# Patient Record
Sex: Male | Born: 1970 | Race: White | Hispanic: No | Marital: Married | Smoking: Never smoker
Health system: Southern US, Community
[De-identification: ages and names within clinical notes are randomized; demographics above are authoritative.]

## PROBLEM LIST (undated history)

## (undated) DIAGNOSIS — I10 Essential (primary) hypertension: Secondary | ICD-10-CM

## (undated) DIAGNOSIS — N200 Calculus of kidney: Secondary | ICD-10-CM

## (undated) DIAGNOSIS — T7840XA Allergy, unspecified, initial encounter: Secondary | ICD-10-CM

## (undated) HISTORY — DX: Allergy, unspecified, initial encounter: T78.40XA

## (undated) HISTORY — DX: Essential (primary) hypertension: I10

## (undated) HISTORY — DX: Calculus of kidney: N20.0

---

## 2009-07-04 ENCOUNTER — Emergency Department: Payer: Self-pay | Admitting: Emergency Medicine

## 2009-07-06 ENCOUNTER — Emergency Department: Payer: Self-pay | Admitting: Emergency Medicine

## 2009-10-17 ENCOUNTER — Encounter: Payer: Self-pay | Admitting: Internal Medicine

## 2010-04-18 ENCOUNTER — Emergency Department: Payer: Self-pay | Admitting: Emergency Medicine

## 2011-07-05 IMAGING — CR LEFT WRIST - COMPLETE 3+ VIEW
1 series · 4 of 4 positions shown · non-contrast
Comparison: none

REASON FOR EXAM: injury
COMMENTS:   May transport without cardiac monitor

PROCEDURE:     DXR - DXR WRIST LT COMP WITH OBLIQUES  - April 18, 2010  [DATE]
RESULT:     Comparison: None.

[Series 1: view not recorded · 0.17mm/px · 4 of 4 slices shown]
[im 1/4]
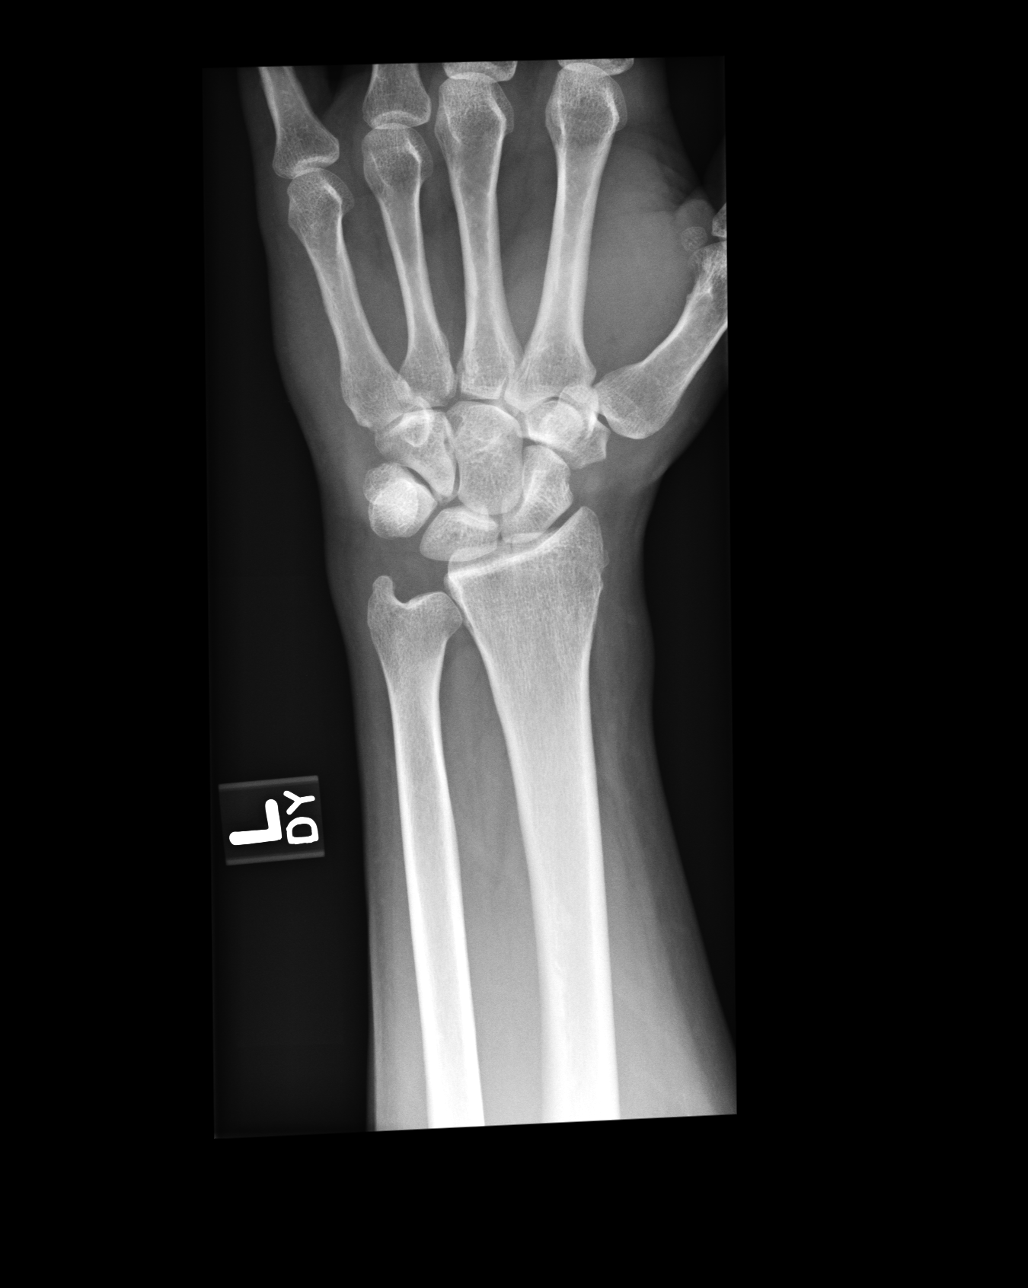
[im 2/4]
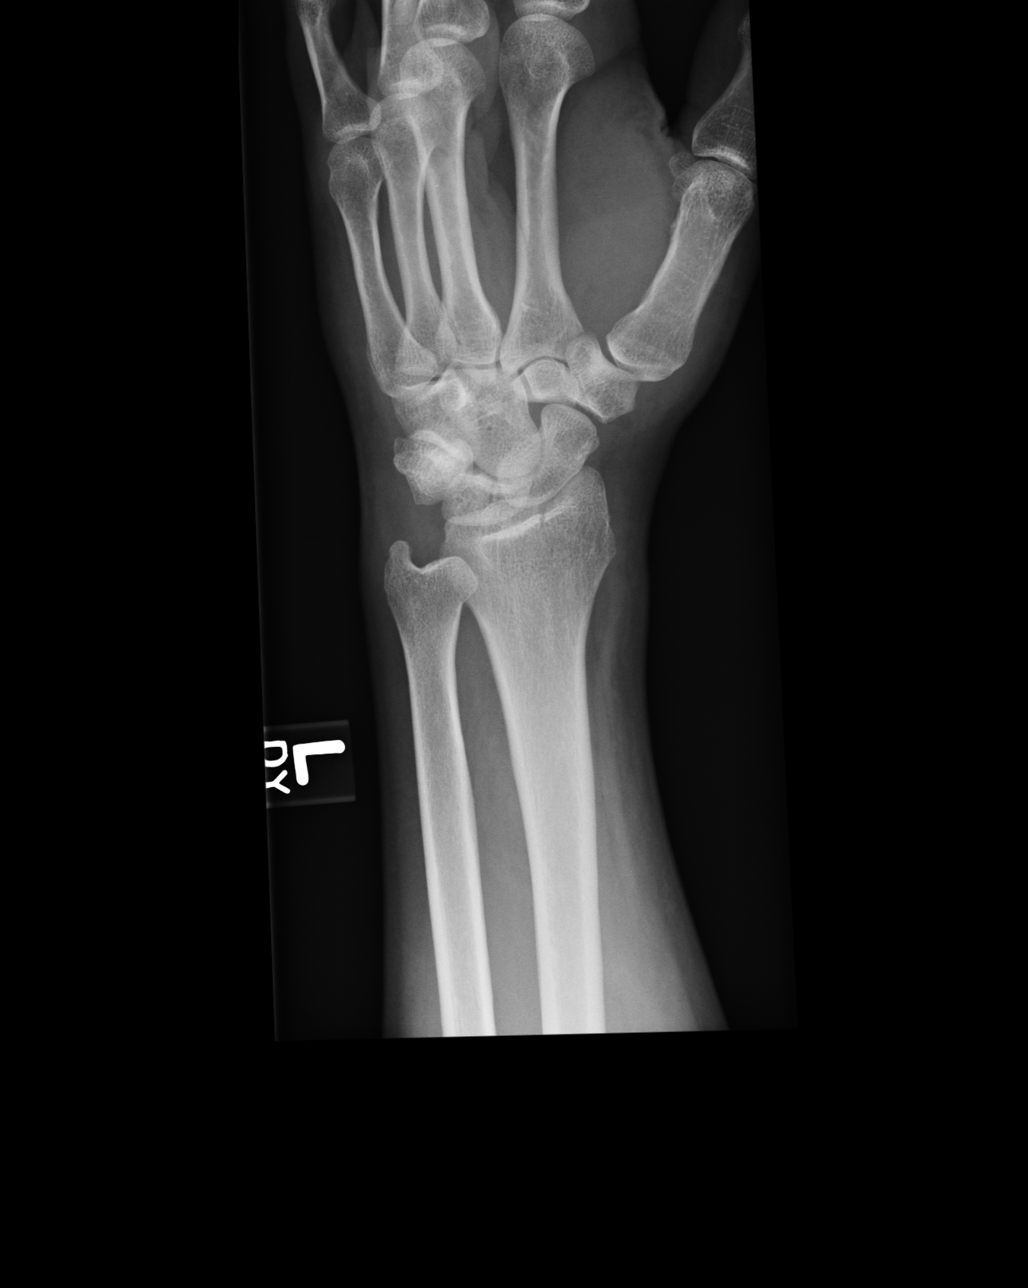
[im 3/4]
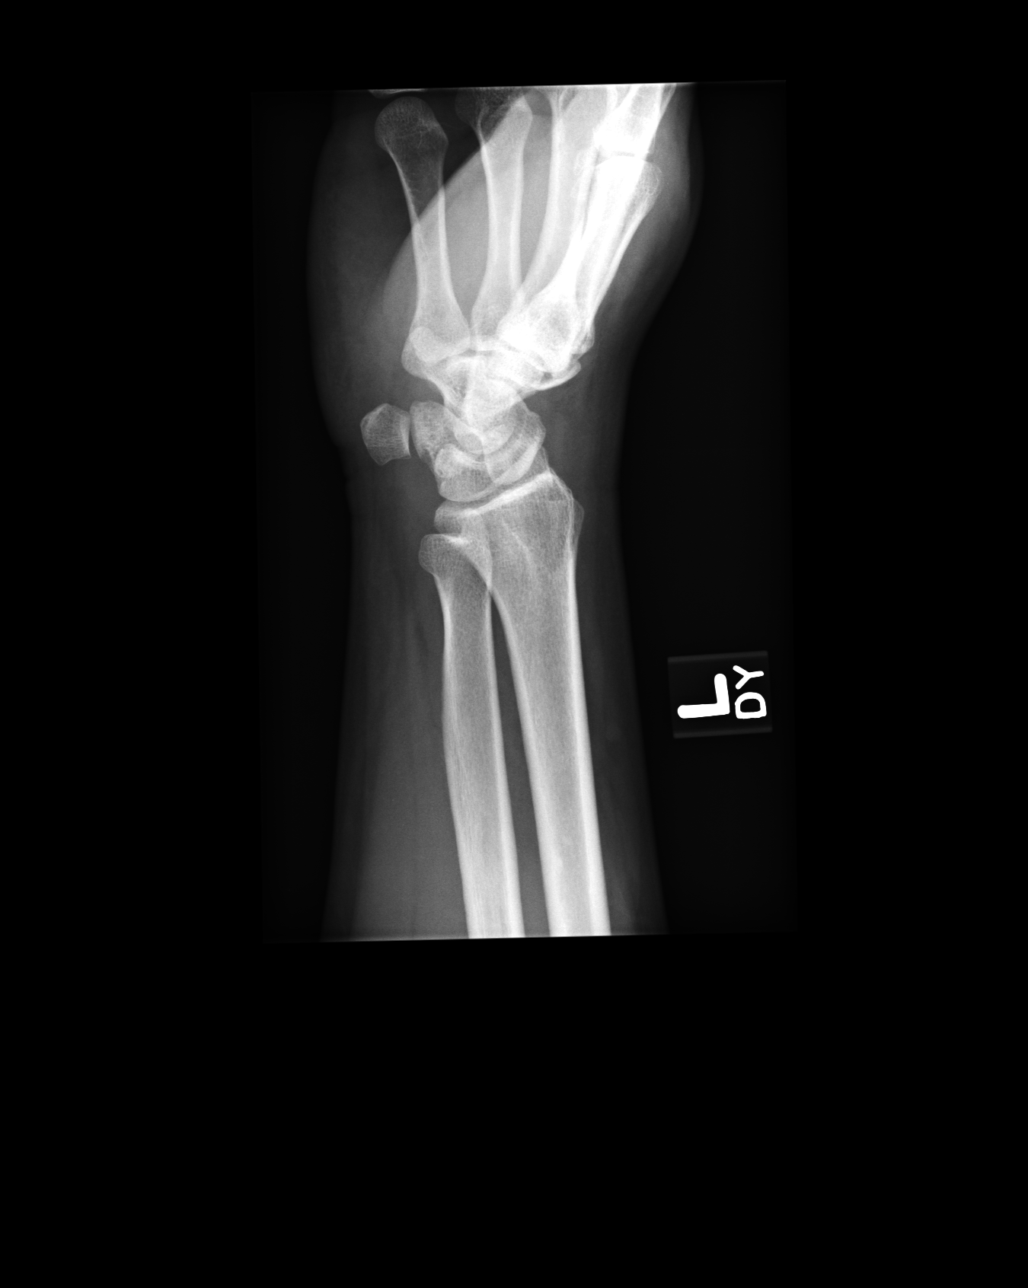
[im 4/4]
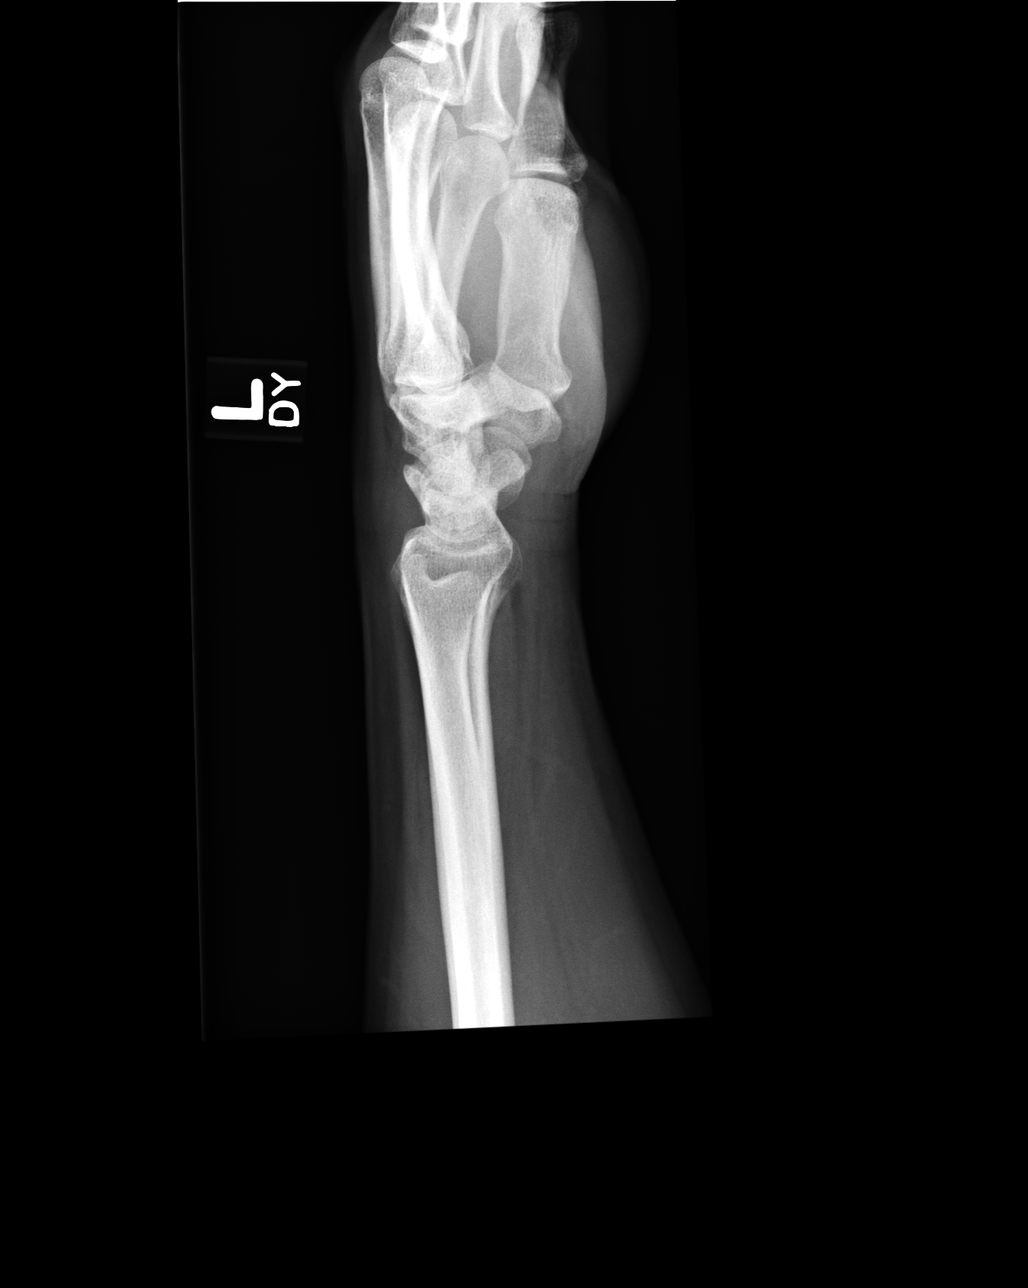

[4 of 4 positions shown; findings below may reference images not displayed]

FINDINGS: There is a nondisplaced fracture the distal radius. The fracture line
extends to the radiocarpal articular surface.
IMPRESSION: Nondisplaced distal radius fracture, with intraarticular extent.

Clinician aware of findings at time of dictation.

## 2012-05-22 ENCOUNTER — Emergency Department: Payer: Self-pay | Admitting: Emergency Medicine

## 2015-04-21 ENCOUNTER — Ambulatory Visit (INDEPENDENT_AMBULATORY_CARE_PROVIDER_SITE_OTHER): Payer: BC Managed Care – PPO | Admitting: Primary Care

## 2015-04-21 ENCOUNTER — Encounter (INDEPENDENT_AMBULATORY_CARE_PROVIDER_SITE_OTHER): Payer: Self-pay

## 2015-04-21 ENCOUNTER — Encounter: Payer: Self-pay | Admitting: Primary Care

## 2015-04-21 VITALS — BP 124/82 | HR 73 | Temp 97.8°F | Ht 72.0 in | Wt 276.1 lb

## 2015-04-21 DIAGNOSIS — I10 Essential (primary) hypertension: Secondary | ICD-10-CM | POA: Diagnosis not present

## 2015-04-21 NOTE — Progress Notes (Signed)
   Subjective:    Patient ID: Grant Keller, male    DOB: 1970/09/14, 44 y.o.   MRN: [id number]  HPI  Grant Keller is a 44 year old male who presents today to establish care and discuss the problems mentioned below. Will obtain old records. Last physical was completed several years ago.  1) Essential Hypertension: Diagnosed 5 years ago. Managed on amlodipine-benazepril 5-40 mg and bisprolol-HCTZ 5-6.25mg . He checks his blood pressure at home once weekly and was in 120's/80's. Denies chest pain, headaches. Will get some dizziness occasionally with postural changes. Denies feelings of syncope.   Review of Systems  Constitutional: Negative for unexpected weight change.  HENT: Negative for rhinorrhea.   Respiratory: Negative for cough and shortness of breath.   Cardiovascular: Negative for chest pain.  Gastrointestinal: Negative for diarrhea and constipation.  Genitourinary: Negative for difficulty urinating.  Musculoskeletal: Negative for myalgias and arthralgias.  Skin: Negative for rash.  Neurological: Negative for numbness and headaches.  Psychiatric/Behavioral:       Denies concerns for anxiety or depression.        Past Medical History  Diagnosis Date  . Allergy   . Hypertension   . Kidney stone     Social History   Social History  . Marital Status: Married    Spouse Name: N/A  . Number of Children: N/A  . Years of Education: N/A   Occupational History  . Not on file.   Social History Main Topics  . Smoking status: Never Smoker   . Smokeless tobacco: Not on file     Comment: used to chew tobacco  . Alcohol Use: 0.0 oz/week    0 Standard drinks or equivalent per week     Comment: social  . Drug Use: Not on file  . Sexual Activity: Not on file   Other Topics Concern  . Not on file   Social History Narrative   Married.   1 son.   Works in [employer].    Enjoys coaching baseball team, playing softball.       History reviewed. No pertinent past surgical  history.  Family History  Problem Relation Age of Onset  . Heart disease Father   . Hypertension Father   . Heart attack Father   . Aplastic anemia Father     Allergies  Allergen Reactions  . Naproxen Other (See Comments)  . Niacin Other (See Comments)    No current outpatient prescriptions on file prior to visit.   No current facility-administered medications on file prior to visit.    BP 124/82 mmHg  Pulse 73  Temp(Src) 97.8 F (36.6 C) (Oral)  Ht 6' (1.829 m)  Wt 276 lb 1.9 oz (125.247 kg)  BMI 37.44 kg/m2  SpO2 97%    Objective:   Physical Exam  Constitutional: He is oriented to person, place, and time. He appears well-nourished.  Cardiovascular: Regular rhythm.   Pulmonary/Chest: Effort normal and breath sounds normal.  Neurological: He is alert and oriented to person, place, and time.  Skin: Skin is warm.  Psychiatric: He has a normal mood and affect.          Assessment & Plan:

## 2015-04-21 NOTE — Assessment & Plan Note (Signed)
Managed on amlodipine-benazepril 5/40mg  and bisprolol-hctz 5/6.25mg  as started by previous provided. Stable today. Continue current regimen.

## 2015-04-21 NOTE — Patient Instructions (Signed)
Please schedule a physical with me in the next 3 months. You will also schedule a lab only appointment one week prior. We will discuss your lab results during your physical.  It was a pleasure to meet you today! Please don't hesitate to call me with any questions. Welcome to [facility]!

## 2015-04-21 NOTE — Progress Notes (Signed)
Pre visit review using our clinic review tool, if applicable. No additional management support is needed unless otherwise documented below in the visit note. 

## 2015-06-24 ENCOUNTER — Ambulatory Visit (INDEPENDENT_AMBULATORY_CARE_PROVIDER_SITE_OTHER): Payer: BC Managed Care – PPO | Admitting: Primary Care

## 2015-06-24 VITALS — BP 132/84 | HR 86 | Temp 97.8°F | Ht 72.0 in | Wt 281.6 lb

## 2015-06-24 DIAGNOSIS — H6691 Otitis media, unspecified, right ear: Secondary | ICD-10-CM

## 2015-06-24 MED ORDER — AMOXICILLIN 500 MG PO CAPS: 500.0000 mg | ORAL_CAPSULE | Freq: Two times a day (BID) | ORAL | Status: DC

## 2015-06-24 NOTE — Progress Notes (Signed)
Subjective:    Patient ID: Grant Keller, male    DOB: March 13, 1971, 44 y.o.   MRN: [id number]  HPI  Mr. Roycroft is a 44 year old male who presents today with a chief complaint of ear pain/pressure. His pain is located to the left ear and has been present for a little over 1 week. He's taken ibuprofen and has used a peroxide treatment daily for 1-2 minutes increments twice daily. Overall this provided temporary relief. His pain became worse over the past 2-3 days. He also has sore throat. Denies fevers, body aches, cough, nasal congestion.   Review of Systems  Constitutional: Negative for fever and chills.  HENT: Positive for ear pain and sore throat. Negative for congestion, postnasal drip and sinus pressure.   Respiratory: Negative for cough and shortness of breath.   Cardiovascular: Negative for chest pain.  Musculoskeletal: Negative for myalgias.  Neurological: Negative for headaches.       Past Medical History  Diagnosis Date  . Allergy   . Hypertension   . Kidney stone     Social History   Social History  . Marital Status: Married    Spouse Name: N/A  . Number of Children: N/A  . Years of Education: N/A   Occupational History  . Not on file.   Social History Main Topics  . Smoking status: Never Smoker   . Smokeless tobacco: Not on file     Comment: used to chew tobacco  . Alcohol Use: 0.0 oz/week    0 Standard drinks or equivalent per week     Comment: social  . Drug Use: Not on file  . Sexual Activity: Not on file   Other Topics Concern  . Not on file   Social History Narrative   Married.   1 son.   Works in [employer].    Enjoys coaching baseball team, playing softball.       No past surgical history on file.  Family History  Problem Relation Age of Onset  . Heart disease Father   . Hypertension Father   . Heart attack Father   . Aplastic anemia Father     Allergies  Allergen Reactions  . Naproxen Other (See Comments)  . Niacin Other (See  Comments)    Current Outpatient Prescriptions on File Prior to Visit  Medication Sig Dispense Refill  . amLODipine-benazepril (LOTREL) 5-40 MG per capsule Take by mouth.    . bisoprolol-hydrochlorothiazide (ZIAC) 5-6.25 MG per tablet Take by mouth.     No current facility-administered medications on file prior to visit.    BP 132/84 mmHg  Pulse 86  Temp(Src) 97.8 F (36.6 C) (Oral)  Ht 6' (1.829 m)  Wt 281 lb 9.6 oz (127.733 kg)  BMI 38.18 kg/m2  SpO2 98%    Objective:   Physical Exam  Constitutional: He appears well-nourished.  HENT:  Right Ear: Ear canal normal. Tympanic membrane is injected and erythematous.  Left Ear: Tympanic membrane and ear canal normal.  Nose: Nose normal. Right sinus exhibits no maxillary sinus tenderness and no frontal sinus tenderness. Left sinus exhibits no maxillary sinus tenderness and no frontal sinus tenderness.  Mouth/Throat: Oropharynx is clear and moist.  Eyes: Conjunctivae are normal. Pupils are equal, round, and reactive to light.  Neck: Neck supple.  Cardiovascular: Normal rate and regular rhythm.   Pulmonary/Chest: Effort normal and breath sounds normal.  Lymphadenopathy:    He has no cervical adenopathy.  Skin: Skin is warm and dry.  Assessment & Plan:  Acute Otitis Media:  Pain and pressure to right ear x 1 week, worse over past 2-3 days. Right TM injected with erythema. Tender to tragus, however canal does not appear very swollen.  Left TM WNL. HEENT exam otherwise unremarkable. Treat with Amoxicillin 500 mg BID x 7 days. Discussed ibuprofen PRN. No q-tips! Follow up PRN

## 2015-06-24 NOTE — Progress Notes (Signed)
Pre visit review using our clinic review tool, if applicable. No additional management support is needed unless otherwise documented below in the visit note. 

## 2015-06-24 NOTE — Patient Instructions (Signed)
Start Amoxicillin antibiotics for ear infection. Take 1 capsule by mouth twice daily for 7 days.  Do not use a q-tip in your ear canal as this may cause irritation.   Please notify me if you do not feel better in the next 3-4 days.  It was a pleasure to see you today!  Otitis Media, Adult Otitis media is redness, soreness, and inflammation of the middle ear. Otitis media may be caused by allergies or, most commonly, by infection. Often it occurs as a complication of the common cold. SIGNS AND SYMPTOMS Symptoms of otitis media may include:  Earache.  Fever.  Ringing in your ear.  Headache.  Leakage of fluid from the ear. DIAGNOSIS To diagnose otitis media, your health care provider will examine your ear with an otoscope. This is an instrument that allows your health care provider to see into your ear in order to examine your eardrum. Your health care provider also will ask you questions about your symptoms. TREATMENT  Typically, otitis media resolves on its own within 3-5 days. Your health care provider may prescribe medicine to ease your symptoms of pain. If otitis media does not resolve within 5 days or is recurrent, your health care provider may prescribe antibiotic medicines if he or she suspects that a bacterial infection is the cause. HOME CARE INSTRUCTIONS   If you were prescribed an antibiotic medicine, finish it all even if you start to feel better.  Take medicines only as directed by your health care provider.  Keep all follow-up visits as directed by your health care provider. SEEK MEDICAL CARE IF:  You have otitis media only in one ear, or bleeding from your nose, or both.  You notice a lump on your neck.  You are not getting better in 3-5 days.  You feel worse instead of better. SEEK IMMEDIATE MEDICAL CARE IF:   You have pain that is not controlled with medicine.  You have swelling, redness, or pain around your ear or stiffness in your neck.  You notice  that part of your face is paralyzed.  You notice that the bone behind your ear (mastoid) is tender when you touch it. MAKE SURE YOU:   Understand these instructions.  Will watch your condition.  Will get help right away if you are not doing well or get worse.   This information is not intended to replace advice given to you by your health care provider. Make sure you discuss any questions you have with your health care provider.   Document Released: 05/14/2004 Document Revised: 08/30/2014 Document Reviewed: 03/06/2013 Elsevier Interactive Patient Education [employer].

## 2015-07-11 ENCOUNTER — Other Ambulatory Visit: Payer: Self-pay | Admitting: Internal Medicine

## 2015-07-11 DIAGNOSIS — Z0000 Encounter for general adult medical examination without abnormal findings: Secondary | ICD-10-CM

## 2015-07-21 ENCOUNTER — Other Ambulatory Visit (INDEPENDENT_AMBULATORY_CARE_PROVIDER_SITE_OTHER): Payer: BC Managed Care – PPO

## 2015-07-21 DIAGNOSIS — Z0000 Encounter for general adult medical examination without abnormal findings: Secondary | ICD-10-CM

## 2015-07-21 LAB — COMPREHENSIVE METABOLIC PANEL
ALT: 29 U/L (ref 0–53)
AST: 22 U/L (ref 0–37)
Albumin: 4.4 g/dL (ref 3.5–5.2)
Alkaline Phosphatase: 71 U/L (ref 39–117)
BUN: 16 mg/dL (ref 6–23)
CHLORIDE: 102 meq/L (ref 96–112)
CO2: 27 meq/L (ref 19–32)
CREATININE: 0.99 mg/dL (ref 0.40–1.50)
Calcium: 9.6 mg/dL (ref 8.4–10.5)
GFR: 87.54 mL/min (ref >60.00)
Glucose, Bld: 104 mg/dL — ABNORMAL HIGH (ref 70–99)
POTASSIUM: 4.3 meq/L (ref 3.5–5.1)
SODIUM: 137 meq/L (ref 135–145)
Total Bilirubin: 0.5 mg/dL (ref 0.2–1.2)
Total Protein: 7.3 g/dL (ref 6.0–8.3)

## 2015-07-21 LAB — LIPID PANEL
CHOL/HDL RATIO: 7
Cholesterol: 190 mg/dL (ref 0–200)
HDL: 28 mg/dL — ABNORMAL LOW (ref >39.00)
LDL CALC: 123 mg/dL — AB (ref 0–99)
NonHDL: 161.87
TRIGLYCERIDES: 192 mg/dL — AB (ref 0.0–149.0)
VLDL: 38.4 mg/dL (ref 0.0–40.0)

## 2015-07-21 LAB — CBC
HCT: 47.2 % (ref 39.0–52.0)
Hemoglobin: 15.6 g/dL (ref 13.0–17.0)
MCHC: 33 g/dL (ref 30.0–36.0)
MCV: 89.5 fl (ref 78.0–100.0)
Platelets: 209 10*3/uL (ref 150.0–400.0)
RBC: 5.27 Mil/uL (ref 4.22–5.81)
RDW: 12.6 % (ref 11.5–15.5)
WBC: 10.3 10*3/uL (ref 4.0–10.5)

## 2015-07-24 ENCOUNTER — Encounter: Payer: Self-pay | Admitting: Primary Care

## 2015-07-24 ENCOUNTER — Ambulatory Visit (INDEPENDENT_AMBULATORY_CARE_PROVIDER_SITE_OTHER): Payer: BC Managed Care – PPO | Admitting: Primary Care

## 2015-07-24 VITALS — BP 128/88 | HR 84 | Temp 97.9°F | Ht 72.0 in | Wt 282.4 lb

## 2015-07-24 DIAGNOSIS — Z0000 Encounter for general adult medical examination without abnormal findings: Secondary | ICD-10-CM | POA: Diagnosis not present

## 2015-07-24 DIAGNOSIS — E781 Pure hyperglyceridemia: Secondary | ICD-10-CM

## 2015-07-24 DIAGNOSIS — E785 Hyperlipidemia, unspecified: Secondary | ICD-10-CM | POA: Insufficient documentation

## 2015-07-24 DIAGNOSIS — R0683 Snoring: Secondary | ICD-10-CM | POA: Diagnosis not present

## 2015-07-24 DIAGNOSIS — Z125 Encounter for screening for malignant neoplasm of prostate: Secondary | ICD-10-CM

## 2015-07-24 DIAGNOSIS — I10 Essential (primary) hypertension: Secondary | ICD-10-CM | POA: Diagnosis not present

## 2015-07-24 DIAGNOSIS — G4733 Obstructive sleep apnea (adult) (pediatric): Secondary | ICD-10-CM | POA: Insufficient documentation

## 2015-07-24 LAB — PSA: PSA: 0.52 ng/mL (ref 0.10–4.00)

## 2015-07-24 LAB — TSH: TSH: 1.72 u[IU]/mL (ref 0.35–4.50)

## 2015-07-24 NOTE — Progress Notes (Signed)
Subjective:    Patient ID: Grant Keller, male    DOB: 1971-03-05, 44 y.o.   MRN: [id number]  HPI  Grant Keller is a 44 year old male who presents today for complete physical.  Immunizations: -Tetanus: Completed in April 2009 -Influenza: Declines   Diet: Endorses a fair diet. Breakfast: BLT at the store Lunch: Hansel Starling, fast food Dinner: Home made meals. Lean meats, pastas, vegetables, rice, potatoes.  Snacks: Occasionally on candy, chips Desserts: Occasionally Beverages: Gatorade, water, coke Exercise: He is not exercising. Plans on starting. Eye exam: Completed years ago. Dental exam: Completed 1-2 months ago.    Review of Systems  Constitutional: Negative for unexpected weight change.  HENT: Negative for rhinorrhea.   Respiratory: Negative for cough and shortness of breath.   Cardiovascular: Negative for chest pain.  Gastrointestinal: Negative for diarrhea and constipation.  Genitourinary: Negative for difficulty urinating.  Musculoskeletal: Negative for myalgias and arthralgias.  Skin: Negative for rash.  Neurological: Negative for dizziness, numbness and headaches.  Psychiatric/Behavioral:       Denies concerns for depression.   1) OSA: History of snoring and wakes up at night gasping for air. His wife notices he stops breathing in the middle of the night. This has been present for the past 2-3 years. He's not exercising or working to lose weight.      Past Medical History  Diagnosis Date  . Allergy   . Hypertension   . Kidney stone     Social History   Social History  . Marital Status: Married    Spouse Name: N/A  . Number of Children: N/A  . Years of Education: N/A   Occupational History  . Not on file.   Social History Main Topics  . Smoking status: Never Smoker   . Smokeless tobacco: Not on file     Comment: used to chew tobacco  . Alcohol Use: 0.0 oz/week    0 Standard drinks or equivalent per week     Comment: social  . Drug Use: Not on file   . Sexual Activity: Not on file   Other Topics Concern  . Not on file   Social History Narrative   Married.   1 son.   Works in [employer].    Enjoys coaching baseball team, playing softball.       No past surgical history on file.  Family History  Problem Relation Age of Onset  . Heart disease Father   . Hypertension Father   . Heart attack Father   . Aplastic anemia Father     Allergies  Allergen Reactions  . Naproxen Other (See Comments)  . Niacin Other (See Comments)    Current Outpatient Prescriptions on File Prior to Visit  Medication Sig Dispense Refill  . amLODipine-benazepril (LOTREL) 5-40 MG per capsule Take by mouth.    . bisoprolol-hydrochlorothiazide (ZIAC) 5-6.25 MG per tablet Take by mouth.     No current facility-administered medications on file prior to visit.    BP 128/88 mmHg  Pulse 84  Temp(Src) 97.9 F (36.6 C) (Oral)  Ht 6' (1.829 m)  Wt 282 lb 6.4 oz (128.096 kg)  BMI 38.29 kg/m2  SpO2 97%     Objective:   Physical Exam  Constitutional: He is oriented to person, place, and time. He appears well-nourished.  HENT:  Right Ear: Tympanic membrane and ear canal normal.  Left Ear: Tympanic membrane and ear canal normal.  Nose: Nose normal.  Mouth/Throat: Oropharynx is clear and  moist.  Eyes: Conjunctivae and EOM are normal. Pupils are equal, round, and reactive to light.  Neck: Neck supple. No thyromegaly present.  Cardiovascular: Normal rate and regular rhythm.   Pulmonary/Chest: Effort normal and breath sounds normal.  Abdominal: Soft. Bowel sounds are normal. There is no tenderness.  Musculoskeletal: Normal range of motion.  Lymphadenopathy:    He has no cervical adenopathy.  Neurological: He is alert and oriented to person, place, and time. He has normal reflexes. No cranial nerve deficit.  Skin: Skin is warm and dry. No rash noted.  Psychiatric: He has a normal mood and affect.          Assessment & Plan:

## 2015-07-24 NOTE — Assessment & Plan Note (Signed)
Snoring, waking up at night gasping for air, obese. Sent for evaluation for sleep apnea.

## 2015-07-24 NOTE — Assessment & Plan Note (Addendum)
Stable today. Continue current regimen. Follow up in 6 months.

## 2015-07-24 NOTE — Progress Notes (Signed)
Pre visit review using our clinic review tool, if applicable. No additional management support is needed unless otherwise documented below in the visit note. 

## 2015-07-24 NOTE — Patient Instructions (Signed)
You will be contacted regarding your referral to Pulmonology for your sleep study.  Please let us know if you have not heard back within one week.   Complete lab work prior to leaving today. I will notify you of your results.  It is important that you improve your diet. Please limit carbohydrates in the form of white bread, rice, pasta, cakes, cookies, sugary drinks, etc. Increase your consumption of fresh fruits and vegetables.  You need to consume about 2 liters of water daily.  Start exercising. You should be getting 1 hour of moderate intensity exercise 5 days weekly.  Start Fish Oil 2000 mg daily.  Schedule a lab only appointment in 3 months for re-check of your cholesterol.  Follow up in 6 months for evaluation of blood pressure.  It was a pleasure to see you today!  Food Choices to Lower Your Triglycerides Triglycerides are a type of fat in your blood. High levels of triglycerides can increase the risk of heart disease and stroke. If your triglyceride levels are high, the foods you eat and your eating habits are very important. Choosing the right foods can help lower your triglycerides.  WHAT GENERAL GUIDELINES DO I NEED TO FOLLOW?  Lose weight if you are overweight.   Limit or avoid alcohol.   Fill one half of your plate with vegetables and green salads.   Limit fruit to two servings a day. Choose fruit instead of juice.   Make one fourth of your plate whole grains. Look for the word "whole" as the first word in the ingredient list.  Fill one fourth of your plate with lean protein foods.  Enjoy fatty fish (such as salmon, mackerel, sardines, and tuna) three times a week.   Choose healthy fats.   Limit foods high in starch and sugar.  Eat more home-cooked food and less restaurant, buffet, and fast food.  Limit fried foods.  Cook foods using methods other than frying.  Limit saturated fats.  Check ingredient lists to avoid foods with partially hydrogenated  oils (trans fats) in them. WHAT FOODS CAN I EAT?  Grains Whole grains, such as whole wheat or whole grain breads, crackers, cereals, and pasta. Unsweetened oatmeal, bulgur, barley, quinoa, or brown rice. Corn or whole wheat flour tortillas.  Vegetables Fresh or frozen vegetables (raw, steamed, roasted, or grilled). Green salads. Fruits All fresh, canned (in natural juice), or frozen fruits. Meat and Other Protein Products Ground beef (85% or leaner), grass-fed beef, or beef trimmed of fat. Skinless chicken or [place]. Ground chicken or [place]. Pork trimmed of fat. All fish and seafood. Eggs. Dried beans, peas, or lentils. Unsalted nuts or seeds. Unsalted canned or dry beans. Dairy Low-fat dairy products, such as skim or 1% milk, 2% or reduced-fat cheeses, low-fat ricotta or cottage cheese, or plain low-fat yogurt. Fats and Oils Tub margarines without trans fats. Light or reduced-fat mayonnaise and salad dressings. Avocado. Safflower, olive, or canola oils. Natural peanut or almond butter. The items listed above may not be a complete list of recommended foods or beverages. Contact your dietitian for more options. WHAT FOODS ARE NOT RECOMMENDED?  Grains White bread. White pasta. White rice. Cornbread. Bagels, pastries, and croissants. Crackers that contain trans fat. Vegetables White potatoes. Corn. Creamed or fried vegetables. Vegetables in a cheese sauce. Fruits Dried fruits. Canned fruit in light or heavy syrup. Fruit juice. Meat and Other Protein Products Fatty cuts of meat. Ribs, chicken wings, bacon, sausage, bologna, salami, chitterlings, fatback, hot dogs, bratwurst,  and packaged luncheon meats. Dairy Whole or 2% milk, cream, half-and-half, and cream cheese. Whole-fat or sweetened yogurt. Full-fat cheeses. Nondairy creamers and whipped toppings. Processed cheese, cheese spreads, or cheese curds. Sweets and Desserts Corn syrup, sugars, honey, and molasses. Candy. Jam and jelly. Syrup.  Sweetened cereals. Cookies, pies, cakes, donuts, muffins, and ice cream. Fats and Oils Butter, stick margarine, lard, shortening, ghee, or bacon fat. Coconut, palm kernel, or palm oils. Beverages Alcohol. Sweetened drinks (such as sodas, lemonade, and fruit drinks or punches). The items listed above may not be a complete list of foods and beverages to avoid. Contact your dietitian for more information.   This information is not intended to replace advice given to you by your health care provider. Make sure you discuss any questions you have with your health care provider.   Document Released: 05/27/2004 Document Revised: 08/30/2014 Document Reviewed: 06/13/2013 Elsevier Interactive Patient Education [employer].

## 2015-07-24 NOTE — Addendum Note (Signed)
Addended by: Pleas Koch on: 07/24/2015 12:18 PM   Modules accepted: Miquel Dunn

## 2015-07-24 NOTE — Assessment & Plan Note (Signed)
Trigs at 192. Handout provided regarding a lower cholesterol/triglyceride diet. Start Fish Oil 2000 mg daily. Repeat in 3 months.

## 2015-07-24 NOTE — Assessment & Plan Note (Signed)
Tdap UTD. Declines flu. Labs with elevated triglycerides. Discussed treatment. Exam unremarkable. Referral placed to pulmonology for evaluation of sleep apnea. PSA and TSH today which were not included in labs last week. Discussed importance of healthy diet and exercise. Follow up in 1 year for repeat physical

## 2015-07-25 ENCOUNTER — Encounter: Payer: Self-pay | Admitting: *Deleted

## 2015-09-29 ENCOUNTER — Encounter: Payer: BC Managed Care – PPO | Admitting: Internal Medicine

## 2015-09-29 NOTE — Progress Notes (Signed)
[facility] Consultation      Assessment and Plan:  Excessive daytime sleepiness.  Snoring.  Obesity.  Essential hypertension.   Date: 09/29/2015  MRN# [id number] WARWICK ROSELLO 1970-11-22  Referring Physician: NP Carlis Abbott.   KADIEN MANGER is a 45 y.o. old male seen in consultation for chief complaint of:   No chief complaint on file.   HPI:   The patient is a 45 year old male referred for symptoms of snoring, excessive daytime sleepiness.      PMHX:   Past Medical History  Diagnosis Date  . Allergy   . Hypertension   . Kidney stone    Surgical Hx:  No past surgical history on file. Family Hx:  Family History  Problem Relation Age of Onset  . Heart disease Father   . Hypertension Father   . Heart attack Father   . Aplastic anemia Father    Social Hx:   Social History  Substance Use Topics  . Smoking status: Never Smoker   . Smokeless tobacco: Not on file     Comment: used to chew tobacco  . Alcohol Use: 0.0 oz/week    0 Standard drinks or equivalent per week     Comment: social   Medication:   Current Outpatient Rx  Name  Route  Sig  Dispense  Refill  . amLODipine-benazepril (LOTREL) 5-40 MG per capsule   Oral   Take by mouth.         . bisoprolol-hydrochlorothiazide (ZIAC) 5-6.25 MG per tablet   Oral   Take by mouth.             Allergies:  Naproxen and Niacin  Review of Systems: Gen:  Denies  fever, sweats, chills HEENT: Denies blurred vision, double vision. bleeds, sore throat Cvc:  No dizziness, chest pain. Resp:   Denies cough or sputum porduction, shortness of breath Gi: Denies swallowing difficulty, stomach pain. Gu:  Denies bladder incontinence, burning urine Ext:   No Joint pain, stiffness. Skin: No skin rash,  hives Endoc:  No polyuria, polydipsia. Psych: No depression, insomnia. Other:  All other systems were reviewed with the patient and were negative other that what is mentioned in the HPI.    Physical Examination:   VS: There were no vitals taken for this visit.  General Appearance: No distress  Neuro:without focal findings,  speech normal,  HEENT: PERRLA, EOM intact.   Pulmonary: normal breath sounds, No wheezing.  CardiovascularNormal S1,S2.  No m/r/g.   Abdomen: Benign, Soft, non-tender. Renal:  No costovertebral tenderness  GU:  No performed at this time. Endoc: No evident thyromegaly, no signs of acromegaly. Skin:   warm, no rashes, no ecchymosis  Extremities: normal, no cyanosis, clubbing.  Other findings:    LABORATORY PANEL:   CBC No results for input(s): WBC, HGB, HCT, PLT in the last 168 hours. ------------------------------------------------------------------------------------------------------------------  Chemistries  No results for input(s): NA, K, CL, CO2, GLUCOSE, BUN, CREATININE, CALCIUM, MG, AST, ALT, ALKPHOS, BILITOT in the last 168 hours.  Invalid input(s): GFRCGP ------------------------------------------------------------------------------------------------------------------  Cardiac Enzymes No results for input(s): TROPONINI in the last 168 hours. ------------------------------------------------------------  RADIOLOGY:  No results found.     Thank  you for the consultation and for allowing [facility] to assist in the care of your patient. Our recommendations are noted above.  Please contact us if we can be of further service.   Marda Stalker, MD.  Board Certified in Internal Medicine, Pulmonary Medicine, Critical Care  Medicine, and Sleep Medicine.  [facility]   Patricia Pesa, M.D.  Vilinda Boehringer, M.D.  Merton Border, M.D  This encounter was created in error - please disregard.

## 2015-10-23 ENCOUNTER — Other Ambulatory Visit (INDEPENDENT_AMBULATORY_CARE_PROVIDER_SITE_OTHER): Payer: BC Managed Care – PPO

## 2015-10-23 DIAGNOSIS — E781 Pure hyperglyceridemia: Secondary | ICD-10-CM

## 2015-10-23 LAB — LIPID PANEL
CHOL/HDL RATIO: 7
Cholesterol: 191 mg/dL (ref 0–200)
HDL: 26.9 mg/dL — ABNORMAL LOW (ref >39.00)
NONHDL: 164.31
Triglycerides: 202 mg/dL — ABNORMAL HIGH (ref 0.0–149.0)
VLDL: 40.4 mg/dL — ABNORMAL HIGH (ref 0.0–40.0)

## 2015-10-23 LAB — LDL CHOLESTEROL, DIRECT: Direct LDL: 118 mg/dL

## 2015-10-24 ENCOUNTER — Telehealth: Payer: Self-pay | Admitting: Primary Care

## 2015-10-24 NOTE — Telephone Encounter (Signed)
Called and notified patient of Kate's comments. Patient verbalized understanding.  

## 2015-10-24 NOTE — Telephone Encounter (Signed)
Pt returned your call re lab results  [phone]

## 2015-11-04 ENCOUNTER — Encounter: Payer: Self-pay | Admitting: Internal Medicine

## 2015-11-04 ENCOUNTER — Ambulatory Visit (INDEPENDENT_AMBULATORY_CARE_PROVIDER_SITE_OTHER): Payer: BC Managed Care – PPO | Admitting: Internal Medicine

## 2015-11-04 VITALS — BP 142/72 | HR 88 | Ht 73.0 in | Wt 277.8 lb

## 2015-11-04 DIAGNOSIS — G4730 Sleep apnea, unspecified: Secondary | ICD-10-CM | POA: Diagnosis not present

## 2015-11-04 MED ORDER — IPRATROPIUM BROMIDE 0.03 % NA SOLN: 2.0000 | Freq: Three times a day (TID) | NASAL | Status: DC

## 2015-11-04 NOTE — Patient Instructions (Addendum)
--  Weight loss may be beneficial.  --Will send for a sleep study.  --Try to wean off the afrin, will start nasal ipratropium 2 puff three times per day. Start ipratropium and replace the use of afrin with the ipratropium.    Sleep Apnea Sleep apnea is disorder that affects a person's sleep. A person with sleep apnea has abnormal pauses in their breathing when they sleep. It is hard for them to get a good sleep. This makes a person tired during the day. It also can lead to other physical problems. There are three types of sleep apnea. One type is when breathing stops for a short time because your airway is blocked (obstructive sleep apnea). Another type is when the brain sometimes fails to give the normal signal to breathe to the muscles that control your breathing (central sleep apnea). The third type is a combination of the other two types. HOME CARE   Take all medicine as told by your doctor.  Avoid alcohol, calming medicines (sedatives), and depressant drugs.  Try to lose weight if you are overweight. Talk to your doctor about a healthy weight goal.  Your doctor may have you use a device that helps to open your airway. It can help you get the air that you need. It is called a positive airway pressure (PAP) device.   MAKE SURE YOU:   Understand these instructions.  Will watch your condition.  Will get help right away if you are not doing well or get worse.  It may take approximately 1 month for you to get used to wearing her CPAP every night.

## 2015-11-04 NOTE — Progress Notes (Signed)
[name]      Assessment and Plan:  Excessive daytime sleepiness with snoring. -With witnessed apneas, symptoms and signs of obstructive sleep apnea.  Essential Hypertension.  -Sleep apnea may worsen, essential hypertension, therefore we'll continue to monitor, and start CPAP if necessary.  Chronic rhinitis.  -Currently with chronic rhinitis, possibly exacerbated by Oxymetalozon (afrin) use which he is currently using 5-6 times per day.  -We'll start ipratropium nasal spray, patient was subjected to try to place the Afrin use with nasal ipratropium over a matter of weeks.  Addendum: I have requested and received and reviewed the patient's old sleep study from every 25th 2011. The patient had a sleep efficiency of 96%, sleep latency was very short at 5 minutes. The patient's apnea-hypopnea index was 10, which were obstructive in origin. Overall it just appeared to be consistent with mild obstructive sleep apnea, there was excessive snoring, total of 381 episodes.  Date: 11/04/2015  MRN# [id number] Grant Keller 21-Jul-1971  Referring Physician: NP Carlis Keller.   Grant Keller is a 45 y.o. old male seen in consultation for chief complaint of:    Chief Complaint  Patient presents with  . sleep consult    pt ref by Grant Keller for snoring. pt. c/o loud snoring, daytime sleepniess, wakes up gasping for air. EPWORTH score: 18    HPI:   The patient is a 45 yo male who presents for symptoms of snoring and gasping for air. He apparently had a sleep study several years ago  but did not have follow-up. His Epworth score today is 18. He typically falls asleep within 5 minutes, he wakes up about 4 times per night. He usually goes to bed around 10 PM, and wakes up around 6 AM. His wife notes that he snores loudly. His brother has problems with falling asleep easily.  Pt notes chronic nasal congestion, he is currently using afrin over the years and has had to  increase its use due to headache when not using it. He also notes that he has chronic sinus congestion, and is now having to use the afrin about 5 to 6 times per day.   He denies reflux. He does have nasal drainage all year long, he has been on afrin for several years. He has a dog which sleeps in bedroom and cat which is occasionally in bed.    PMHX:   Past Medical History  Diagnosis Date  . Allergy   . Hypertension   . Kidney stone    Surgical Hx:  No past surgical history on file. Family Hx:  Family History  Problem Relation Age of Onset  . Heart disease Father   . Hypertension Father   . Heart attack Father   . Aplastic anemia Father    Social Hx:   Social History  Substance Use Topics  . Smoking status: Never Smoker   . Smokeless tobacco: Not on file     Comment: used to chew tobacco  . Alcohol Use: 0.0 oz/week    0 Standard drinks or equivalent per week     Comment: social   Medication:   Current Outpatient Rx  Name  Route  Sig  Dispense  Refill  . amLODipine-benazepril (LOTREL) 5-40 MG per capsule   Oral   Take by mouth.         . bisoprolol-hydrochlorothiazide (ZIAC) 5-6.25 MG per tablet   Oral   Take by mouth.  Allergies:  Naproxen and Niacin  Review of Systems: Gen:  Denies  fever, sweats, chills HEENT: Denies blurred vision, double vision. bleeds, sore throat Cvc:  No dizziness, chest pain. Resp:   Denies cough or sputum porduction, shortness of breath Gi: Denies swallowing difficulty, stomach pain. Gu:  Denies bladder incontinence, burning urine Ext:   No Joint pain, stiffness. Skin: No skin rash,  hives Endoc:  No polyuria, polydipsia. Psych: No depression, insomnia. Other:  All other systems were reviewed with the patient and were negative other that what is mentioned in the HPI.   Physical Examination:   VS: BP 142/72 mmHg  Pulse 88  Ht 6\' 1"  (1.854 m)  Wt 277 lb 12.8 oz (126.009 kg)  BMI 36.66 kg/m2  SpO2 94%    General Appearance: No distress  Neuro:without focal findings,  speech normal,  HEENT: PERRLA, EOM intact.   Pulmonary: normal breath sounds, No wheezing.  CardiovascularNormal S1,S2.  No m/r/g.   Abdomen: Benign, Soft, non-tender. Renal:  No costovertebral tenderness  GU:  No performed at this time. Endoc: No evident thyromegaly, no signs of acromegaly. Skin:   warm, no rashes, no ecchymosis  Extremities: normal, no cyanosis, clubbing.  Other findings:    LABORATORY PANEL:   CBC No results for input(s): WBC, HGB, HCT, PLT in the last 168 hours. ------------------------------------------------------------------------------------------------------------------  Chemistries  No results for input(s): NA, K, CL, CO2, GLUCOSE, BUN, CREATININE, CALCIUM, MG, AST, ALT, ALKPHOS, BILITOT in the last 168 hours.  Invalid input(s): GFRCGP ------------------------------------------------------------------------------------------------------------------  Cardiac Enzymes No results for input(s): TROPONINI in the last 168 hours. ------------------------------------------------------------  RADIOLOGY:  No results found.     Thank  you for the consultation and for allowing [facility] to assist in the care of your patient. Our recommendations are noted above.  Please contact us if we can be of further service.   Grant Stalker, MD.  Board Certified in Internal Medicine, Pulmonary Medicine, [place], and Sleep Medicine.  [facility]  Grant Keller, M.D.  Grant Keller, M.D.  Grant Keller, M.D

## 2015-11-13 ENCOUNTER — Telehealth: Payer: Self-pay | Admitting: *Deleted

## 2015-11-13 DIAGNOSIS — G4733 Obstructive sleep apnea (adult) (pediatric): Secondary | ICD-10-CM

## 2015-11-13 NOTE — Telephone Encounter (Signed)
Auto CPAP order placed per DR.

## 2015-12-29 ENCOUNTER — Other Ambulatory Visit: Payer: Self-pay | Admitting: Primary Care

## 2015-12-30 NOTE — Telephone Encounter (Addendum)
Electronically refill request. Have not been prescribed by [name]. Last seen on 07/24/2015 for CPE. Follow up schedule on 01/22/2016.  Will sent refills, per Kate's notes on 04/21/15 and 07/24/2015 to continue regimen.

## 2016-01-22 ENCOUNTER — Encounter: Payer: Self-pay | Admitting: Primary Care

## 2016-01-22 ENCOUNTER — Ambulatory Visit (INDEPENDENT_AMBULATORY_CARE_PROVIDER_SITE_OTHER): Payer: BC Managed Care – PPO | Admitting: Primary Care

## 2016-01-22 VITALS — BP 144/94 | HR 78 | Temp 97.6°F | Ht 73.0 in | Wt 275.0 lb

## 2016-01-22 DIAGNOSIS — I10 Essential (primary) hypertension: Secondary | ICD-10-CM

## 2016-01-22 DIAGNOSIS — G4733 Obstructive sleep apnea (adult) (pediatric): Secondary | ICD-10-CM

## 2016-01-22 DIAGNOSIS — E781 Pure hyperglyceridemia: Secondary | ICD-10-CM

## 2016-01-22 NOTE — Assessment & Plan Note (Signed)
Has an appointment tomorrow to be fitted for Cpap.

## 2016-01-22 NOTE — Assessment & Plan Note (Signed)
Above goal in the clinic today, will have him monitor home BP readings and e-mail me in 2 weeks. If above goal will increase medication dose/add in new medication. Discussed the role of improvement in diet and exercise in lowering hypertension.

## 2016-01-22 NOTE — Patient Instructions (Signed)
Check your blood pressure daily, around the same time of day, for the next 2 weeks.   Ensure that you have rested for 30 minutes prior to checking your blood pressure. Record your readings and e-mail me in 2 weeks.  Continue Fish Oil daily to help lower your triglycerides.  We will see you in December this year for your annual physical.  It was a pleasure to see you today!  DASH Eating Plan DASH stands for "Dietary Approaches to Stop Hypertension." The DASH eating plan is a healthy eating plan that has been shown to reduce high blood pressure (hypertension). Additional health benefits may include reducing the risk of type 2 diabetes mellitus, heart disease, and stroke. The DASH eating plan may also help with weight loss. WHAT DO I NEED TO KNOW ABOUT THE DASH EATING PLAN? For the DASH eating plan, you will follow these general guidelines:  Choose foods with a percent daily value for sodium of less than 5% (as listed on the food label).  Use salt-free seasonings or herbs instead of table salt or sea salt.  Check with your health care provider or pharmacist before using salt substitutes.  Eat lower-sodium products, often labeled as "lower sodium" or "no salt added."  Eat fresh foods.  Eat more vegetables, fruits, and low-fat dairy products.  Choose whole grains. Look for the word "whole" as the first word in the ingredient list.  Choose fish and skinless chicken or [place] more often than red meat. Limit fish, poultry, and meat to 6 oz (170 g) each day.  Limit sweets, desserts, sugars, and sugary drinks.  Choose heart-healthy fats.  Limit cheese to 1 oz (28 g) per day.  Eat more home-cooked food and less restaurant, buffet, and fast food.  Limit fried foods.  Cook foods using methods other than frying.  Limit canned vegetables. If you do use them, rinse them well to decrease the sodium.  When eating at a restaurant, ask that your food be prepared with less salt, or no salt if  possible. WHAT FOODS CAN I EAT? Seek help from a dietitian for individual calorie needs. Grains Whole grain or whole wheat bread. Brown rice. Whole grain or whole wheat pasta. Quinoa, bulgur, and whole grain cereals. Low-sodium cereals. Corn or whole wheat flour tortillas. Whole grain cornbread. Whole grain crackers. Low-sodium crackers. Vegetables Fresh or frozen vegetables (raw, steamed, roasted, or grilled). Low-sodium or reduced-sodium tomato and vegetable juices. Low-sodium or reduced-sodium tomato sauce and paste. Low-sodium or reduced-sodium canned vegetables.  Fruits All fresh, canned (in natural juice), or frozen fruits. Meat and Other Protein Products Ground beef (85% or leaner), grass-fed beef, or beef trimmed of fat. Skinless chicken or [place]. Ground chicken or [place]. Pork trimmed of fat. All fish and seafood. Eggs. Dried beans, peas, or lentils. Unsalted nuts and seeds. Unsalted canned beans. Dairy Low-fat dairy products, such as skim or 1% milk, 2% or reduced-fat cheeses, low-fat ricotta or cottage cheese, or plain low-fat yogurt. Low-sodium or reduced-sodium cheeses. Fats and Oils Tub margarines without trans fats. Light or reduced-fat mayonnaise and salad dressings (reduced sodium). Avocado. Safflower, olive, or canola oils. Natural peanut or almond butter. Other Unsalted popcorn and pretzels. The items listed above may not be a complete list of recommended foods or beverages. Contact your dietitian for more options. WHAT FOODS ARE NOT RECOMMENDED? Grains White bread. White pasta. White rice. Refined cornbread. Bagels and croissants. Crackers that contain trans fat. Vegetables Creamed or fried vegetables. Vegetables in a cheese sauce.  Regular canned vegetables. Regular canned tomato sauce and paste. Regular tomato and vegetable juices. Fruits Dried fruits. Canned fruit in light or heavy syrup. Fruit juice. Meat and Other Protein Products Fatty cuts of meat. Ribs, chicken  wings, bacon, sausage, bologna, salami, chitterlings, fatback, hot dogs, bratwurst, and packaged luncheon meats. Salted nuts and seeds. Canned beans with salt. Dairy Whole or 2% milk, cream, half-and-half, and cream cheese. Whole-fat or sweetened yogurt. Full-fat cheeses or blue cheese. Nondairy creamers and whipped toppings. Processed cheese, cheese spreads, or cheese curds. Condiments Onion and garlic salt, seasoned salt, table salt, and sea salt. Canned and packaged gravies. Worcestershire sauce. Tartar sauce. Barbecue sauce. Teriyaki sauce. Soy sauce, including reduced sodium. Steak sauce. Fish sauce. Oyster sauce. Cocktail sauce. Horseradish. Ketchup and mustard. Meat flavorings and tenderizers. Bouillon cubes. Hot sauce. Tabasco sauce. Marinades. Taco seasonings. Relishes. Fats and Oils Butter, stick margarine, lard, shortening, ghee, and bacon fat. Coconut, palm kernel, or palm oils. Regular salad dressings. Other Pickles and olives. Salted popcorn and pretzels. The items listed above may not be a complete list of foods and beverages to avoid. Contact your dietitian for more information. WHERE CAN I FIND MORE INFORMATION? National Heart, Lung, and Blood Institute: travelstabloid.com   This information is not intended to replace advice given to you by your health care provider. Make sure you discuss any questions you have with your health care provider.   Document Released: 07/29/2011 Document Revised: 08/30/2014 Document Reviewed: 06/13/2013 Elsevier Interactive Patient Education [employer].

## 2016-01-22 NOTE — Progress Notes (Signed)
   Subjective:    Patient ID: Grant Keller, male    DOB: 06-Feb-1971, 45 y.o.   MRN: [id number]  HPI  Mr. Karber is a 45 year year old male who presents today for follow up.  1) Hyperlipidemia: Triglycerides increased in March up to 200's. He was encouraged to take Fish Oil daily but hadn't been doing so. Since his last visit he's been taking his fish oil most days of the week. He's working to improve his diet and is now playing softball several days weekly.  2) Essential Hypertension: Currently managed on amlodipine-benazepril 5/40 mg. His BP is above goal in the clinic today at 144/94, although he is currently undergoing treatment for a moderate case of poison ivy. Denies chest pain, dizziness, headaches. He is not checking his BP at home.  3) OSA: Evaluated by Pulmonology in March and determined to have OSA based off of old sleep test results. He has an appointment tomorrow to get fitted for his CPAP.   Review of Systems  Respiratory: Negative for shortness of breath.   Cardiovascular: Negative for chest pain.  Neurological: Negative for dizziness and headaches.       Past Medical History  Diagnosis Date  . Allergy   . Hypertension   . Kidney stone      Social History   Social History  . Marital Status: Married    Spouse Name: N/A  . Number of Children: N/A  . Years of Education: N/A   Occupational History  . Not on file.   Social History Main Topics  . Smoking status: Never Smoker   . Smokeless tobacco: Not on file     Comment: used to chew tobacco  . Alcohol Use: 0.0 oz/week    0 Standard drinks or equivalent per week     Comment: social  . Drug Use: Not on file  . Sexual Activity: Not on file   Other Topics Concern  . Not on file   Social History Narrative   Married.   1 son.   Works in [employer].    Enjoys coaching baseball team, playing softball.       No past surgical history on file.  Family History  Problem Relation Age of Onset  . Heart  disease Father   . Hypertension Father   . Heart attack Father   . Aplastic anemia Father     Allergies  Allergen Reactions  . Naproxen Other (See Comments)  . Niacin Other (See Comments)    Current Outpatient Prescriptions on File Prior to Visit  Medication Sig Dispense Refill  . amLODipine-benazepril (LOTREL) 5-40 MG capsule TAKE 1 CAPSULE BY MOUTH ONCE DAILY. 30 capsule 5  . ipratropium (ATROVENT) 0.03 % nasal spray Place 2 sprays into both nostrils 3 (three) times daily. 30 mL 6   No current facility-administered medications on file prior to visit.    BP 144/94 mmHg  Pulse 78  Temp(Src) 97.6 F (36.4 C) (Oral)  Ht 6\' 1"  (1.854 m)  Wt 275 lb (124.739 kg)  BMI 36.29 kg/m2  SpO2 97%    Objective:   Physical Exam  Constitutional: He appears well-nourished.  Cardiovascular: Normal rate and regular rhythm.   Pulmonary/Chest: Effort normal and breath sounds normal.  Skin: Skin is warm and dry.  Rash to bilateral upper extremities, appears to be healing appropriately. Currently on prednisone.          Assessment & Plan:

## 2016-01-22 NOTE — Assessment & Plan Note (Signed)
Taking fish oil most days. Also working to improve diet and activity levels. Will recheck in December during his physical.

## 2016-02-05 ENCOUNTER — Telehealth: Payer: Self-pay | Admitting: Primary Care

## 2016-02-05 NOTE — Telephone Encounter (Signed)
Spoken to patient and he stated that the last time he check his BP was 127/88. It has been running around that.

## 2016-02-05 NOTE — Telephone Encounter (Signed)
Noted  

## 2016-02-05 NOTE — Telephone Encounter (Signed)
-----   Message from Pleas Koch, NP sent at 01/22/2016  8:14 AM EDT ----- Regarding: BP Please check on Mr. Kelnhofer's blood pressure. He was supposed to check his readings over the past 2 weeks.

## 2016-04-13 ENCOUNTER — Emergency Department
Admission: EM | Admit: 2016-04-13 | Discharge: 2016-04-13 | Disposition: A | Discharge status: Home / Self Care | Payer: BC Managed Care – PPO | Attending: Student in an Organized Health Care Education/Training Program | Admitting: Student in an Organized Health Care Education/Training Program

## 2016-04-13 DIAGNOSIS — Y92410 Unspecified street and highway as the place of occurrence of the external cause: Secondary | ICD-10-CM | POA: Diagnosis not present

## 2016-04-13 DIAGNOSIS — S199XXA Unspecified injury of neck, initial encounter: Secondary | ICD-10-CM | POA: Diagnosis present

## 2016-04-13 DIAGNOSIS — Y999 Unspecified external cause status: Secondary | ICD-10-CM | POA: Insufficient documentation

## 2016-04-13 DIAGNOSIS — S161XXA Strain of muscle, fascia and tendon at neck level, initial encounter: Secondary | ICD-10-CM | POA: Insufficient documentation

## 2016-04-13 DIAGNOSIS — Z79899 Other long term (current) drug therapy: Secondary | ICD-10-CM | POA: Insufficient documentation

## 2016-04-13 DIAGNOSIS — M7918 Myalgia, other site: Secondary | ICD-10-CM

## 2016-04-13 DIAGNOSIS — I10 Essential (primary) hypertension: Secondary | ICD-10-CM | POA: Diagnosis not present

## 2016-04-13 DIAGNOSIS — Y9389 Activity, other specified: Secondary | ICD-10-CM | POA: Insufficient documentation

## 2016-04-13 MED ORDER — IBUPROFEN 600 MG PO TABS: 600.0000 mg | ORAL_TABLET | Freq: Three times a day (TID) | ORAL | 0 refills | Status: DC | PRN

## 2016-04-13 MED ORDER — IBUPROFEN 600 MG PO TABS
600.0000 mg | ORAL_TABLET | Freq: Once | ORAL | Status: AC
  Administered 2016-04-13: 600 mg via ORAL
  Filled 2016-04-13: qty 1

## 2016-04-13 MED ORDER — METHOCARBAMOL 500 MG PO TABS
1000.0000 mg | ORAL_TABLET | Freq: Once | ORAL | Status: AC
  Administered 2016-04-13: 1000 mg via ORAL
  Filled 2016-04-13: qty 2

## 2016-04-13 MED ORDER — OXYCODONE-ACETAMINOPHEN 5-325 MG PO TABS
1.0000 | ORAL_TABLET | Freq: Once | ORAL | Status: AC
  Administered 2016-04-13: 1 via ORAL
  Filled 2016-04-13: qty 1

## 2016-04-13 MED ORDER — METHOCARBAMOL 750 MG PO TABS: 1500.0000 mg | ORAL_TABLET | Freq: Four times a day (QID) | ORAL | 0 refills | Status: DC

## 2016-04-13 MED ORDER — OXYCODONE-ACETAMINOPHEN 7.5-325 MG PO TABS: 1.0000 | ORAL_TABLET | Freq: Four times a day (QID) | ORAL | 0 refills | Status: DC | PRN
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
## 2016-07-17 ENCOUNTER — Other Ambulatory Visit: Payer: Self-pay | Admitting: Primary Care

## 2016-07-17 DIAGNOSIS — I10 Essential (primary) hypertension: Secondary | ICD-10-CM

## 2016-07-17 DIAGNOSIS — E785 Hyperlipidemia, unspecified: Secondary | ICD-10-CM

## 2016-07-17 DIAGNOSIS — R739 Hyperglycemia, unspecified: Secondary | ICD-10-CM

## 2016-07-22 ENCOUNTER — Other Ambulatory Visit (INDEPENDENT_AMBULATORY_CARE_PROVIDER_SITE_OTHER): Payer: BC Managed Care – PPO

## 2016-07-22 DIAGNOSIS — E785 Hyperlipidemia, unspecified: Secondary | ICD-10-CM | POA: Diagnosis not present

## 2016-07-22 DIAGNOSIS — R739 Hyperglycemia, unspecified: Secondary | ICD-10-CM | POA: Diagnosis not present

## 2016-07-22 DIAGNOSIS — I10 Essential (primary) hypertension: Secondary | ICD-10-CM | POA: Diagnosis not present

## 2016-07-23 LAB — COMPREHENSIVE METABOLIC PANEL
ALK PHOS: 74 U/L (ref 39–117)
ALT: 25 U/L (ref 0–53)
AST: 23 U/L (ref 0–37)
Albumin: 4.7 g/dL (ref 3.5–5.2)
BUN: 13 mg/dL (ref 6–23)
CHLORIDE: 101 meq/L (ref 96–112)
CO2: 28 mEq/L (ref 19–32)
Calcium: 9.8 mg/dL (ref 8.4–10.5)
Creatinine, Ser: 1.06 mg/dL (ref 0.40–1.50)
GFR: 80.53 mL/min (ref >60.00)
GLUCOSE: 95 mg/dL (ref 70–99)
POTASSIUM: 4.2 meq/L (ref 3.5–5.1)
SODIUM: 138 meq/L (ref 135–145)
TOTAL PROTEIN: 7.6 g/dL (ref 6.0–8.3)
Total Bilirubin: 0.5 mg/dL (ref 0.2–1.2)

## 2016-07-23 LAB — LDL CHOLESTEROL, DIRECT: LDL DIRECT: 120 mg/dL

## 2016-07-23 LAB — LIPID PANEL
CHOL/HDL RATIO: 8
CHOLESTEROL: 199 mg/dL (ref 0–200)
HDL: 25.5 mg/dL — ABNORMAL LOW (ref >39.00)
NONHDL: 173.04
Triglycerides: 301 mg/dL — ABNORMAL HIGH (ref 0.0–149.0)
VLDL: 60.2 mg/dL — AB (ref 0.0–40.0)

## 2016-07-23 LAB — HEMOGLOBIN A1C: Hgb A1c MFr Bld: 5.1 % (ref 4.6–6.5)

## 2016-07-29 ENCOUNTER — Encounter: Payer: BC Managed Care – PPO | Admitting: Primary Care

## 2016-08-11 ENCOUNTER — Other Ambulatory Visit: Payer: Self-pay | Admitting: Primary Care

## 2016-08-25 ENCOUNTER — Ambulatory Visit (INDEPENDENT_AMBULATORY_CARE_PROVIDER_SITE_OTHER): Payer: BC Managed Care – PPO | Admitting: Primary Care

## 2016-08-25 ENCOUNTER — Encounter: Payer: Self-pay | Admitting: Primary Care

## 2016-08-25 VITALS — BP 122/82 | HR 71 | Temp 97.8°F | Ht 73.0 in | Wt 280.1 lb

## 2016-08-25 DIAGNOSIS — I10 Essential (primary) hypertension: Secondary | ICD-10-CM

## 2016-08-25 DIAGNOSIS — E782 Mixed hyperlipidemia: Secondary | ICD-10-CM | POA: Diagnosis not present

## 2016-08-25 DIAGNOSIS — E781 Pure hyperglyceridemia: Secondary | ICD-10-CM

## 2016-08-25 DIAGNOSIS — Z0001 Encounter for general adult medical examination with abnormal findings: Secondary | ICD-10-CM

## 2016-08-25 DIAGNOSIS — L309 Dermatitis, unspecified: Secondary | ICD-10-CM | POA: Insufficient documentation

## 2016-08-25 DIAGNOSIS — G4733 Obstructive sleep apnea (adult) (pediatric): Secondary | ICD-10-CM

## 2016-08-25 DIAGNOSIS — Z0000 Encounter for general adult medical examination without abnormal findings: Secondary | ICD-10-CM

## 2016-08-25 MED ORDER — TRIAMCINOLONE ACETONIDE 0.1 % EX CREA: TOPICAL_CREAM | CUTANEOUS | 0 refills | Status: DC

## 2016-08-25 MED ORDER — ATORVASTATIN CALCIUM 20 MG PO TABS: ORAL_TABLET | ORAL | 3 refills | Status: DC

## 2016-08-25 NOTE — Progress Notes (Signed)
Subjective:    Patient ID: Grant Keller, male    DOB: 10/27/1970, 46 y.o.   MRN: [id number]  HPI  Grant Keller is a 46 year old male who presents today for complete physical.  Immunizations: -Tetanus: Completed within the last 10 years. -Influenza: Declines   Diet: Grant Keller endorses a poor diet. Breakfast: Sausage and egg sandwich Lunch: Pizza, fast food Dinner: Pasta, fast food, restaurants Snacks: None Desserts: Daily Beverages: Little water, lemonade, soda  Exercise: Grant Keller does not currently exercise, but has joined [employer]. Eye exam: Has not completed recently. Dental exam: Completes semi-annually.   Review of Systems  Constitutional: Negative for unexpected weight change.  HENT: Negative for rhinorrhea.   Respiratory: Negative for cough and shortness of breath.   Cardiovascular: Negative for chest pain.  Gastrointestinal: Negative for constipation and diarrhea.  Genitourinary: Negative for difficulty urinating.  Musculoskeletal: Positive for arthralgias. Negative for myalgias.  Skin: Positive for rash.  Allergic/Immunologic: Negative for environmental allergies.  Neurological: Negative for dizziness, numbness and headaches.  Psychiatric/Behavioral:       Grant Keller denies concerns for depression. Does have some concern for anxiety, overall manges well.       Past Medical History:  Diagnosis Date  . Allergy   . Hypertension   . Kidney stone      Social History   Social History  . Marital status: Married    Spouse name: N/A  . Number of children: N/A  . Years of education: N/A   Occupational History  . Not on file.   Social History Main Topics  . Smoking status: Never Smoker  . Smokeless tobacco: Not on file     Comment: used to chew tobacco  . Alcohol use 0.0 oz/week     Comment: social  . Drug use: Unknown  . Sexual activity: Not on file   Other Topics Concern  . Not on file   Social History Narrative   Married.   1 son.   Works in [employer].    Enjoys coaching baseball team, playing softball.       No past surgical history on file.  Family History  Problem Relation Age of Onset  . Heart disease Father   . Hypertension Father   . Heart attack Father   . Aplastic anemia Father     Allergies  Allergen Reactions  . Naproxen Other (See Comments)  . Niacin Other (See Comments)    Current Outpatient Prescriptions on File Prior to Visit  Medication Sig Dispense Refill  . amLODipine-benazepril (LOTREL) 5-40 MG capsule TAKE 1 CAPSULE BY MOUTH ONCE DAILY. 30 capsule 1  . ipratropium (ATROVENT) 0.03 % nasal spray Place 2 sprays into both nostrils 3 (three) times daily. 30 mL 6   No current facility-administered medications on file prior to visit.     BP 122/82   Pulse 71   Temp 97.8 F (36.6 C) (Oral)   Ht 6\' 1"  (1.854 m)   Wt 280 lb 1.9 oz (127.1 kg)   SpO2 95%   BMI 36.96 kg/m    Objective:   Physical Exam  Constitutional: Grant Keller is oriented to person, place, and time. Grant Keller appears well-nourished.  HENT:  Right Ear: Tympanic membrane and ear canal normal.  Left Ear: Tympanic membrane and ear canal normal.  Nose: Nose normal. Right sinus exhibits no maxillary sinus tenderness and no frontal sinus tenderness. Left sinus exhibits no maxillary sinus tenderness and no frontal sinus tenderness.  Mouth/Throat: Oropharynx is clear and  moist.  Eyes: Conjunctivae and EOM are normal. Pupils are equal, round, and reactive to light.  Neck: Neck supple. Carotid bruit is not present. No thyromegaly present.  Cardiovascular: Normal rate, regular rhythm and normal heart sounds.   Pulmonary/Chest: Effort normal and breath sounds normal. Grant Keller has no wheezes. Grant Keller has no rales.  Abdominal: Soft. Bowel sounds are normal. There is no tenderness.  Musculoskeletal: Normal range of motion.  Neurological: Grant Keller is alert and oriented to person, place, and time. Grant Keller has normal reflexes. No cranial nerve deficit.  Skin: Skin is warm and dry.  Mild to  moderate eczema type rash to abdomen, left upper arm. This occurs daily during winter months.  Psychiatric: Grant Keller has a normal mood and affect.          Assessment & Plan:

## 2016-08-25 NOTE — Assessment & Plan Note (Signed)
Stable in office today, will continue to monitor.

## 2016-08-25 NOTE — Assessment & Plan Note (Signed)
Long history of. Flares during winter months only. Temporary improvement with lotion, rash occurs daily. Rx for low dose Triamcinolone cream sent to pharmacy. He will update if no improvement.

## 2016-08-25 NOTE — Assessment & Plan Note (Signed)
Td UTD, declines influenza vaccination. Discussed the importance of a healthy diet and regular exercise in order for weight loss, and to reduce the risk of other medical diseases. Exam stable. Labs with hyperlipidemia which were discussed and treated. Follow up in 4 months for repeat labs, 1 year for annual exam.

## 2016-08-25 NOTE — Assessment & Plan Note (Signed)
Using Cpap.

## 2016-08-25 NOTE — Progress Notes (Signed)
Pre visit review using our clinic review tool, if applicable. No additional management support is needed unless otherwise documented below in the visit note. 

## 2016-08-25 NOTE — Patient Instructions (Signed)
Start atorvastatin 20 mg tablets for high cholesterol. Take 1 tablet by mouth every evening at bedtime.  It's importance to improve your diet by reducing consumption of fast food, fried food, processed snack foods, sugary drinks. Increase consumption of fresh vegetables and fruits, whole grains, water.  Ensure you are drinking 64 ounces of water daily.  Start exercising. You should be getting 150 minutes of moderate intensity exercise weekly.  Schedule a lab only appointment in 4 months to recheck your cholesterol.  Follow up in 1 year for annual exam.  It was a pleasure to see you today!  Food Choices to Lower Your Triglycerides Triglycerides are a type of fat in your blood. High levels of triglycerides can increase the risk of heart disease and stroke. If your triglyceride levels are high, the foods you eat and your eating habits are very important. Choosing the right foods can help lower your triglycerides. What general guidelines do I need to follow?  Lose weight if you are overweight.  Limit or avoid alcohol.  Fill one half of your plate with vegetables and green salads.  Limit fruit to two servings a day. Choose fruit instead of juice.  Make one fourth of your plate whole grains. Look for the word "whole" as the first word in the ingredient list.  Fill one fourth of your plate with lean protein foods.  Enjoy fatty fish (such as salmon, mackerel, sardines, and tuna) three times a week.  Choose healthy fats.  Limit foods high in starch and sugar.  Eat more home-cooked food and less restaurant, buffet, and fast food.  Limit fried foods.  Cook foods using methods other than frying.  Limit saturated fats.  Check ingredient lists to avoid foods with partially hydrogenated oils (trans fats) in them. What foods can I eat? Grains  Whole grains, such as whole wheat or whole grain breads, crackers, cereals, and pasta. Unsweetened oatmeal, bulgur, barley, quinoa, or brown  rice. Corn or whole wheat flour tortillas. Vegetables  Fresh or frozen vegetables (raw, steamed, roasted, or grilled). Green salads. Fruits  All fresh, canned (in natural juice), or frozen fruits. Meat and Other Protein Products  Ground beef (85% or leaner), grass-fed beef, or beef trimmed of fat. Skinless chicken or [place]. Ground chicken or [place]. Pork trimmed of fat. All fish and seafood. Eggs. Dried beans, peas, or lentils. Unsalted nuts or seeds. Unsalted canned or dry beans. Dairy  Low-fat dairy products, such as skim or 1% milk, 2% or reduced-fat cheeses, low-fat ricotta or cottage cheese, or plain low-fat yogurt. Fats and Oils  Tub margarines without trans fats. Light or reduced-fat mayonnaise and salad dressings. Avocado. Safflower, olive, or canola oils. Natural peanut or almond butter. The items listed above may not be a complete list of recommended foods or beverages. Contact your dietitian for more options.  What foods are not recommended? Grains  White bread. White pasta. White rice. Cornbread. Bagels, pastries, and croissants. Crackers that contain trans fat. Vegetables  White potatoes. Corn. Creamed or fried vegetables. Vegetables in a cheese sauce. Fruits  Dried fruits. Canned fruit in light or heavy syrup. Fruit juice. Meat and Other Protein Products  Fatty cuts of meat. Ribs, chicken wings, bacon, sausage, bologna, salami, chitterlings, fatback, hot dogs, bratwurst, and packaged luncheon meats. Dairy  Whole or 2% milk, cream, half-and-half, and cream cheese. Whole-fat or sweetened yogurt. Full-fat cheeses. Nondairy creamers and whipped toppings. Processed cheese, cheese spreads, or cheese curds. Sweets and Desserts  Corn syrup, sugars, honey,  and molasses. Candy. Jam and jelly. Syrup. Sweetened cereals. Cookies, pies, cakes, donuts, muffins, and ice cream. Fats and Oils  Butter, stick margarine, lard, shortening, ghee, or bacon fat. Coconut, palm kernel, or palm  oils. Beverages  Alcohol. Sweetened drinks (such as sodas, lemonade, and fruit drinks or punches). The items listed above may not be a complete list of foods and beverages to avoid. Contact your dietitian for more information.  This information is not intended to replace advice given to you by your health care provider. Make sure you discuss any questions you have with your health care provider. Document Released: 05/27/2004 Document Revised: 01/15/2016 Document Reviewed: 06/13/2013 Elsevier Interactive Patient Education  2017 [employer].

## 2016-08-25 NOTE — Assessment & Plan Note (Signed)
Has not been exercising, is eating a poor diet, is not taking Fish Oil. Long discussion today regarding treatment options. He's not sure he's able to exercise and change his diet, he opts for medication. LFT's today unremarkable. Rx for atorvastatin 20 mg sent to pharmacy, discussed potential side effects. Follow up in 4 months for repeat lipids and LFT's. Strongly encouraged healthy diet and regular exercise.

## 2016-10-22 ENCOUNTER — Other Ambulatory Visit: Payer: Self-pay | Admitting: Primary Care

## 2016-12-14 ENCOUNTER — Other Ambulatory Visit: Payer: Self-pay | Admitting: Primary Care

## 2016-12-14 DIAGNOSIS — E781 Pure hyperglyceridemia: Secondary | ICD-10-CM

## 2016-12-23 ENCOUNTER — Other Ambulatory Visit (INDEPENDENT_AMBULATORY_CARE_PROVIDER_SITE_OTHER): Payer: BC Managed Care – PPO

## 2016-12-23 DIAGNOSIS — E781 Pure hyperglyceridemia: Secondary | ICD-10-CM | POA: Diagnosis not present

## 2016-12-23 LAB — LIPID PANEL
CHOL/HDL RATIO: 4
Cholesterol: 126 mg/dL (ref 0–200)
HDL: 31.8 mg/dL — ABNORMAL LOW (ref >39.00)
LDL CALC: 70 mg/dL (ref 0–99)
NONHDL: 94.17
Triglycerides: 121 mg/dL (ref 0.0–149.0)
VLDL: 24.2 mg/dL (ref 0.0–40.0)

## 2016-12-23 LAB — HEPATIC FUNCTION PANEL
ALK PHOS: 83 U/L (ref 39–117)
ALT: 41 U/L (ref 0–53)
AST: 27 U/L (ref 0–37)
Albumin: 4.8 g/dL (ref 3.5–5.2)
BILIRUBIN DIRECT: 0.1 mg/dL (ref 0.0–0.3)
BILIRUBIN TOTAL: 0.7 mg/dL (ref 0.2–1.2)
TOTAL PROTEIN: 7.6 g/dL (ref 6.0–8.3)

## 2016-12-26 ENCOUNTER — Emergency Department
Admission: EM | Admit: 2016-12-26 | Discharge: 2016-12-26 | Disposition: A | Discharge status: Home / Self Care | Payer: BC Managed Care – PPO | Attending: Emergency Medicine | Admitting: Emergency Medicine

## 2016-12-26 ENCOUNTER — Emergency Department: Payer: BC Managed Care – PPO

## 2016-12-26 ENCOUNTER — Encounter: Payer: Self-pay | Admitting: Emergency Medicine

## 2016-12-26 DIAGNOSIS — M62838 Other muscle spasm: Secondary | ICD-10-CM | POA: Diagnosis not present

## 2016-12-26 DIAGNOSIS — R109 Unspecified abdominal pain: Secondary | ICD-10-CM

## 2016-12-26 DIAGNOSIS — M7918 Myalgia, other site: Secondary | ICD-10-CM

## 2016-12-26 DIAGNOSIS — I10 Essential (primary) hypertension: Secondary | ICD-10-CM | POA: Insufficient documentation

## 2016-12-26 DIAGNOSIS — R0781 Pleurodynia: Secondary | ICD-10-CM | POA: Insufficient documentation

## 2016-12-26 DIAGNOSIS — Z79899 Other long term (current) drug therapy: Secondary | ICD-10-CM | POA: Diagnosis not present

## 2016-12-26 DIAGNOSIS — R1012 Left upper quadrant pain: Secondary | ICD-10-CM | POA: Diagnosis not present

## 2016-12-26 LAB — BASIC METABOLIC PANEL
ANION GAP: 8 (ref 5–15)
BUN: 12 mg/dL (ref 6–20)
CALCIUM: 9.3 mg/dL (ref 8.9–10.3)
CO2: 25 mmol/L (ref 22–32)
CREATININE: 0.87 mg/dL (ref 0.61–1.24)
Chloride: 105 mmol/L (ref 101–111)
GFR calc Af Amer: >60 mL/min (ref >60)
GFR calc non Af Amer: >60 mL/min (ref >60)
GLUCOSE: 105 mg/dL — AB (ref 65–99)
Potassium: 4.2 mmol/L (ref 3.5–5.1)
Sodium: 138 mmol/L (ref 135–145)

## 2016-12-26 LAB — TROPONIN I: Troponin I: <0.03 ng/mL (ref <0.03)

## 2016-12-26 LAB — CBC WITH DIFFERENTIAL/PLATELET
BASOS PCT: 1 %
Basophils Absolute: 0.1 10*3/uL (ref 0–0.1)
Eosinophils Absolute: 0.3 10*3/uL (ref 0–0.7)
Eosinophils Relative: 2 %
HEMATOCRIT: 43.4 % (ref 40.0–52.0)
Hemoglobin: 14.9 g/dL (ref 13.0–18.0)
Lymphocytes Relative: 20 %
Lymphs Abs: 2 10*3/uL (ref 1.0–3.6)
MCH: 29.7 pg (ref 26.0–34.0)
MCHC: 34.4 g/dL (ref 32.0–36.0)
MCV: 86.3 fL (ref 80.0–100.0)
MONO ABS: 0.9 10*3/uL (ref 0.2–1.0)
MONOS PCT: 9 %
NEUTROS ABS: 7.1 10*3/uL — AB (ref 1.4–6.5)
Neutrophils Relative %: 68 %
Platelets: 175 10*3/uL (ref 150–440)
RBC: 5.02 MIL/uL (ref 4.40–5.90)
RDW: 12.8 % (ref 11.5–14.5)
WBC: 10.4 10*3/uL (ref 3.8–10.6)

## 2016-12-26 LAB — HEPATIC FUNCTION PANEL
ALBUMIN: 4.6 g/dL (ref 3.5–5.0)
ALT: 46 U/L (ref 17–63)
AST: 44 U/L — ABNORMAL HIGH (ref 15–41)
Alkaline Phosphatase: 81 U/L (ref 38–126)
BILIRUBIN DIRECT: 0.1 mg/dL (ref 0.1–0.5)
BILIRUBIN TOTAL: 0.9 mg/dL (ref 0.3–1.2)
Indirect Bilirubin: 0.8 mg/dL (ref 0.3–0.9)
Total Protein: 7.8 g/dL (ref 6.5–8.1)

## 2016-12-26 LAB — URINALYSIS, COMPLETE (UACMP) WITH MICROSCOPIC
BILIRUBIN URINE: NEGATIVE
Bacteria, UA: NONE SEEN
GLUCOSE, UA: NEGATIVE mg/dL
HGB URINE DIPSTICK: NEGATIVE
KETONES UR: NEGATIVE mg/dL
LEUKOCYTES UA: NEGATIVE
NITRITE: NEGATIVE
PH: 6 (ref 5.0–8.0)
Protein, ur: NEGATIVE mg/dL
Specific Gravity, Urine: 1.011 (ref 1.005–1.030)
Squamous Epithelial / LPF: NONE SEEN

## 2016-12-26 LAB — FIBRIN DERIVATIVES D-DIMER (ARMC ONLY): FIBRIN DERIVATIVES D-DIMER (ARMC): 476.25 (ref 0.00–499.00)

## 2016-12-26 LAB — LIPASE, BLOOD: LIPASE: 23 U/L (ref 11–51)

## 2016-12-26 MED ORDER — CYCLOBENZAPRINE HCL 10 MG PO TABS
ORAL_TABLET | ORAL | Status: AC
  Administered 2016-12-26: 10 mg via ORAL
  Filled 2016-12-26: qty 1

## 2016-12-26 MED ORDER — IBUPROFEN 800 MG PO TABS: 800.0000 mg | ORAL_TABLET | Freq: Three times a day (TID) | ORAL | 0 refills | Status: DC | PRN

## 2016-12-26 MED ORDER — CYCLOBENZAPRINE HCL 10 MG PO TABS
10.0000 mg | ORAL_TABLET | Freq: Once | ORAL | Status: AC
  Administered 2016-12-26: 10 mg via ORAL

## 2016-12-26 MED ORDER — CYCLOBENZAPRINE HCL 10 MG PO TABS: 10.0000 mg | ORAL_TABLET | Freq: Three times a day (TID) | ORAL | 0 refills | Status: DC | PRN

## 2016-12-26 MED ORDER — KETOROLAC TROMETHAMINE 30 MG/ML IJ SOLN
INTRAMUSCULAR | Status: AC
  Administered 2016-12-26: 30 mg via INTRAVENOUS
  Filled 2016-12-26: qty 1

## 2016-12-26 MED ORDER — KETOROLAC TROMETHAMINE 30 MG/ML IJ SOLN
30.0000 mg | Freq: Once | INTRAMUSCULAR | Status: AC
  Administered 2016-12-26: 30 mg via INTRAVENOUS

## 2016-12-26 NOTE — ED Triage Notes (Signed)
Pt c/o left flank pain, that started over the past several days, states the pain feels similarly to kidney stone in the past. States the pain is constant and no position makes it better or worse.

## 2016-12-26 NOTE — ED Notes (Signed)
Pt placed on monitor. EKG performed 

## 2016-12-26 NOTE — ED Provider Notes (Signed)
[facility] Emergency Department Provider Note ____________________________________________   I have reviewed the triage vital signs and the triage nursing note.  HISTORY  Chief Complaint Flank Pain   Historian Patient and wife  HPI Grant Keller is a 46 y.o. male presenting with several days of left lower posterior chest pain.  Thought might be a pulled muscle, but no known injury.  Did play softball yesterday.  No fever.  Not worse with movement of trunk, but worse with breathing deep, and so has been splinting small breaths.  No cough.  No bm changes.  Did not initially feel like it was involving the abdomen, but on pressing it does wrap to the left abdomen.  Pain is moderate to severe.  He has taken nothing for pain today  Allergy to naproxen with hives, but tolerated advil, ibuprofen fine.    Past Medical History:  Diagnosis Date  . Allergy   . Hypertension   . Kidney stone     Patient Active Problem List   Diagnosis Date Noted  . Eczema 08/25/2016  . OSA (obstructive sleep apnea) 07/24/2015  . Preventative health care 07/24/2015  . Hypertriglyceridemia 07/24/2015  . Essential hypertension 04/21/2015    History reviewed. No pertinent surgical history.  Prior to Admission medications   Medication Sig Start Date End Date Taking? Authorizing Provider  amLODipine-benazepril (LOTREL) 5-40 MG capsule TAKE 1 CAPSULE BY MOUTH ONCE DAILY. 10/22/16  Yes Pleas Koch, NP  atorvastatin (LIPITOR) 20 MG tablet Take 1 tablet by mouth every evening at bedtime. 08/25/16  Yes Pleas Koch, NP  triamcinolone cream (KENALOG) 0.1 % Apply to affected area twice daily for 1 week at a time. 08/25/16  Yes Pleas Koch, NP  cyclobenzaprine (FLEXERIL) 10 MG tablet Take 1 tablet (10 mg total) by mouth 3 (three) times daily as needed for muscle spasms. 12/26/16   Lisa Roca, MD  ibuprofen (ADVIL,MOTRIN) 800 MG tablet Take 1 tablet (800 mg total) by mouth  every 8 (eight) hours as needed. 12/26/16   Lisa Roca, MD  ipratropium (ATROVENT) 0.03 % nasal spray Place 2 sprays into both nostrils 3 (three) times daily. Patient not taking: Reported on 12/26/2016 11/04/15   Laverle Hobby, MD    Allergies  Allergen Reactions  . Naproxen Hives    Family History  Problem Relation Age of Onset  . Heart disease Father   . Hypertension Father   . Heart attack Father   . Aplastic anemia Father     Social History Social History  Substance Use Topics  . Smoking status: Never Smoker  . Smokeless tobacco: Never Used     Comment: used to chew tobacco  . Alcohol use 0.0 oz/week     Comment: social    Review of Systems  Constitutional: Negative for fever. Eyes: Negative for visual changes. ENT: Negative for sore throat. Cardiovascular: Negative for central or anterior chest pain.  Pleuritic lower posterior chest pain. Respiratory: Not short of breath, but breathing shallow due to pain. Gastrointestinal: Negative for abdominal pain, vomiting and diarrhea. Genitourinary: Negative for dysuria. Musculoskeletal: Negative for spine pain. Skin: Negative for rash. Neurological: Negative for headache. 10 point Review of Systems otherwise negative ____________________________________________   PHYSICAL EXAM:  VITAL SIGNS: ED Triage Vitals  Enc Vitals Group     BP 12/26/16 0921 (!) 155/94     Pulse Rate 12/26/16 0921 (!) 101     Resp 12/26/16 0921 18     Temp 12/26/16 7510  98.7 F (37.1 C)     Temp Source 12/26/16 0921 Oral     SpO2 12/26/16 0921 97 %     Weight 12/26/16 0922 260 lb (117.9 kg)     Height 12/26/16 0922 6\' 2"  (1.88 m)     Head Circumference --      Peak Flow --      Pain Score 12/26/16 0924 6     Pain Loc --      Pain Edu? --      Excl. in [place]? --      Constitutional: Alert and oriented. Well appearing and in no distress. HEENT   Head: Normocephalic and atraumatic.      Eyes: Conjunctivae are normal. PERRL.  Normal extraocular movements.      Ears:         Nose: No congestion/rhinnorhea.   Mouth/Throat: Mucous membranes are moist.   Neck: No stridor. Cardiovascular/Chest: Normal rate, regular rhythm.  No murmurs, rubs, or gallops. Respiratory: Normal respiratory effort without tachypnea nor retractions. Breath sounds are clear and equal bilaterally. No wheezes/rales/rhonchi.  No rib tenderness to palpation or compression. Gastrointestinal: Soft. No distention, no guarding, no rebound. Nontender.  Mild tenderness epigastrium and left upper quadrant  Genitourinary/rectal:Deferred Musculoskeletal: Palpable muscle spasm that is tender to palpation at lower left posterior rib margin.  No joint effusions.  No lower extremity tenderness.  No edema. Neurologic:  Normal speech and language. No gross or focal neurologic deficits are appreciated. Skin:  Skin is warm, dry and intact. No rash noted. Psychiatric: Mood and affect are normal. Speech and behavior are normal. Patient exhibits appropriate insight and judgment.   ____________________________________________  LABS (pertinent positives/negatives)  Labs Reviewed  URINALYSIS, COMPLETE (UACMP) WITH MICROSCOPIC - Abnormal; Notable for the following:       Result Value   Color, Urine YELLOW (*)    APPearance CLEAR (*)    All other components within normal limits  BASIC METABOLIC PANEL - Abnormal; Notable for the following:    Glucose, Bld 105 (*)    All other components within normal limits  CBC WITH DIFFERENTIAL/PLATELET - Abnormal; Notable for the following:    Neutro Abs 7.1 (*)    All other components within normal limits  HEPATIC FUNCTION PANEL - Abnormal; Notable for the following:    AST 44 (*)    All other components within normal limits  LIPASE, BLOOD  FIBRIN DERIVATIVES D-DIMER ([facility] ONLY)  TROPONIN I    ____________________________________________    EKG I, Lisa Roca, MD, the attending physician have personally viewed  and interpreted all ECGs.  75 bpm. Normal sinus rhythm. Normal axis. Normal ST and T-wave ____________________________________________  RADIOLOGY All Xrays were viewed by me. Imaging interpreted by Radiologist.  CXR: IMPRESSION: Mild interstitial prominence -likely chronic.  No other significant abnormalities. __________________________________________  PROCEDURES  Procedure(s) performed: None  Critical Care performed: None  ____________________________________________   ED COURSE / ASSESSMENT AND PLAN  Pertinent labs & imaging results that were available during my care of the patient were reviewed by me and considered in my medical decision making (see chart for details).   Pain is concentrated at a tender muscle spasm that was partially reduced with massage/pressure.  However, he does have some pleuritic component, and tenderness into left and upper abdomen, so I am going to initiate wide evaluation.  Ultrasounds are reassuring. Chest x-ray is normal. D-dimer is less than 499. Symptoms are unlikely a low risk for PE in my estimation. Troponin is  normal. Lipase is normal.  Patient feels somewhat improved after symptomatic medications here.  Urinalysis with 0-5 white blood cells and red blood cells, I don't think that this is significant enough for warranting CT imaging. At this point he has no abdominal pain I feel that his symptoms are most consistent with musculoskeletal/muscle spasm and we discussed this and he is comfortable going home with Flexeril as well as ibuprofen scheduled.  We discussed return precautions. He will follow up with primary care doctor.     CONSULTATIONS:   None  Patient / Family / Caregiver informed of clinical course, medical decision-making process, and agree with plan.   I discussed return precautions, follow-up instructions, and discharge instructions with patient and/or family.   ___________________________________________   FINAL  CLINICAL IMPRESSION(S) / ED DIAGNOSES   Final diagnoses:  Left flank pain  Musculoskeletal pain  Muscle spasm              Note: This dictation was prepared with Dragon dictation. Any transcriptional errors that result from this process are unintentional    Lisa Roca, MD 12/26/16 1301

## 2016-12-26 NOTE — Discharge Instructions (Signed)
Although no certain cause was found, your exam and evaluation are reassuring in the emergency department today.  I am most suspicious of muscle spasm as a source of the pain.  Return to the emergency department immediately for any new or worsening pain, weakness, numbness, abdominal pain, chest pain, sweats, or any other symptoms concerning to you.

## 2017-04-13 ENCOUNTER — Other Ambulatory Visit: Payer: Self-pay | Admitting: Primary Care

## 2017-04-13 DIAGNOSIS — E782 Mixed hyperlipidemia: Secondary | ICD-10-CM

## 2017-06-29 ENCOUNTER — Other Ambulatory Visit: Payer: Self-pay | Admitting: Primary Care

## 2017-10-11 ENCOUNTER — Other Ambulatory Visit: Payer: Self-pay | Admitting: Primary Care

## 2017-10-11 DIAGNOSIS — E782 Mixed hyperlipidemia: Secondary | ICD-10-CM

## 2017-11-20 ENCOUNTER — Other Ambulatory Visit: Payer: Self-pay | Admitting: Primary Care

## 2018-01-24 ENCOUNTER — Other Ambulatory Visit: Payer: Self-pay | Admitting: Primary Care

## 2018-01-24 DIAGNOSIS — L309 Dermatitis, unspecified: Secondary | ICD-10-CM

## 2018-03-14 IMAGING — CR DG CHEST 2V
2 series · 2 of 2 positions shown · non-contrast
Comparison: None.

CLINICAL DATA: Left chest pain for 1 day.

EXAM:
CHEST  2 VIEW

[chest pa]
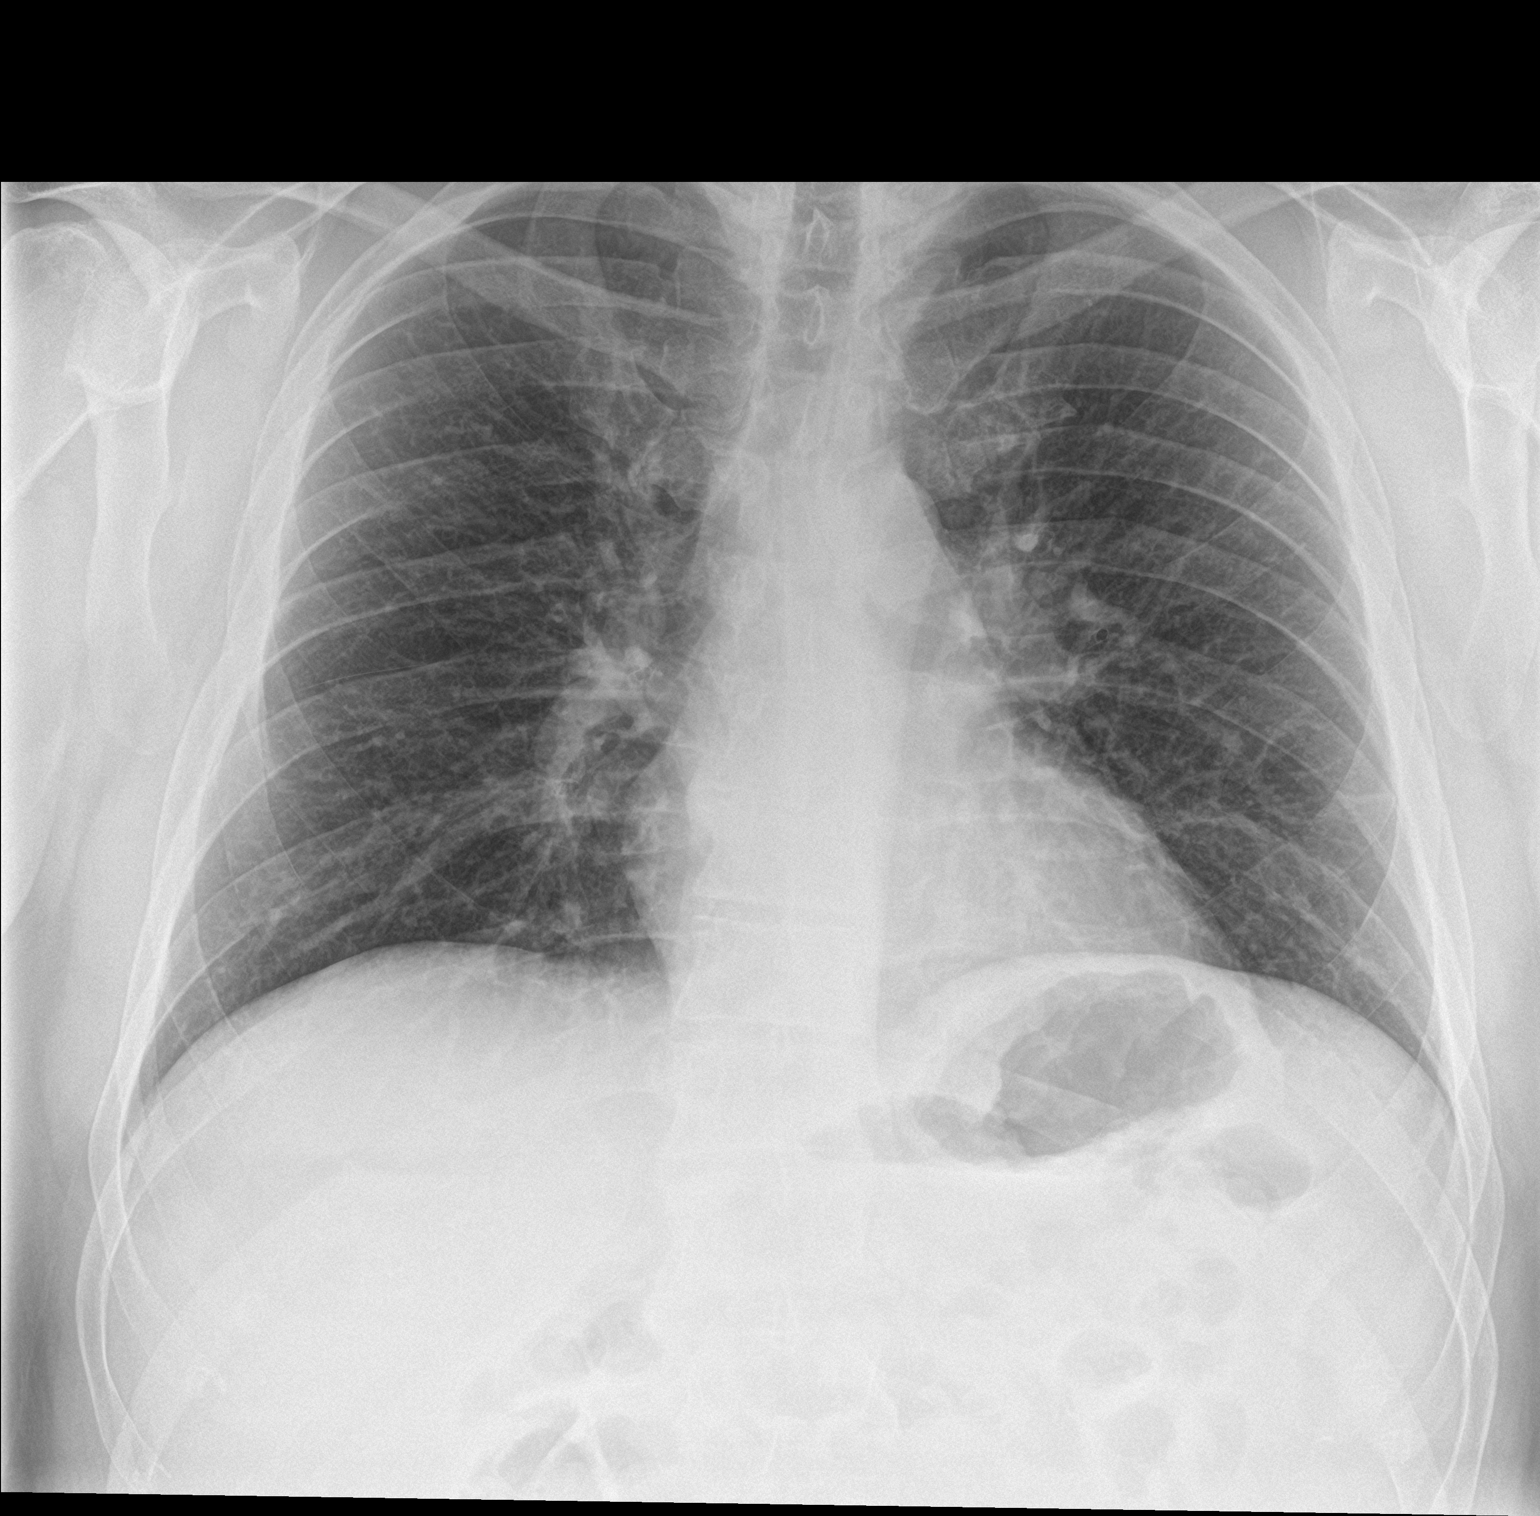

[chest lat]
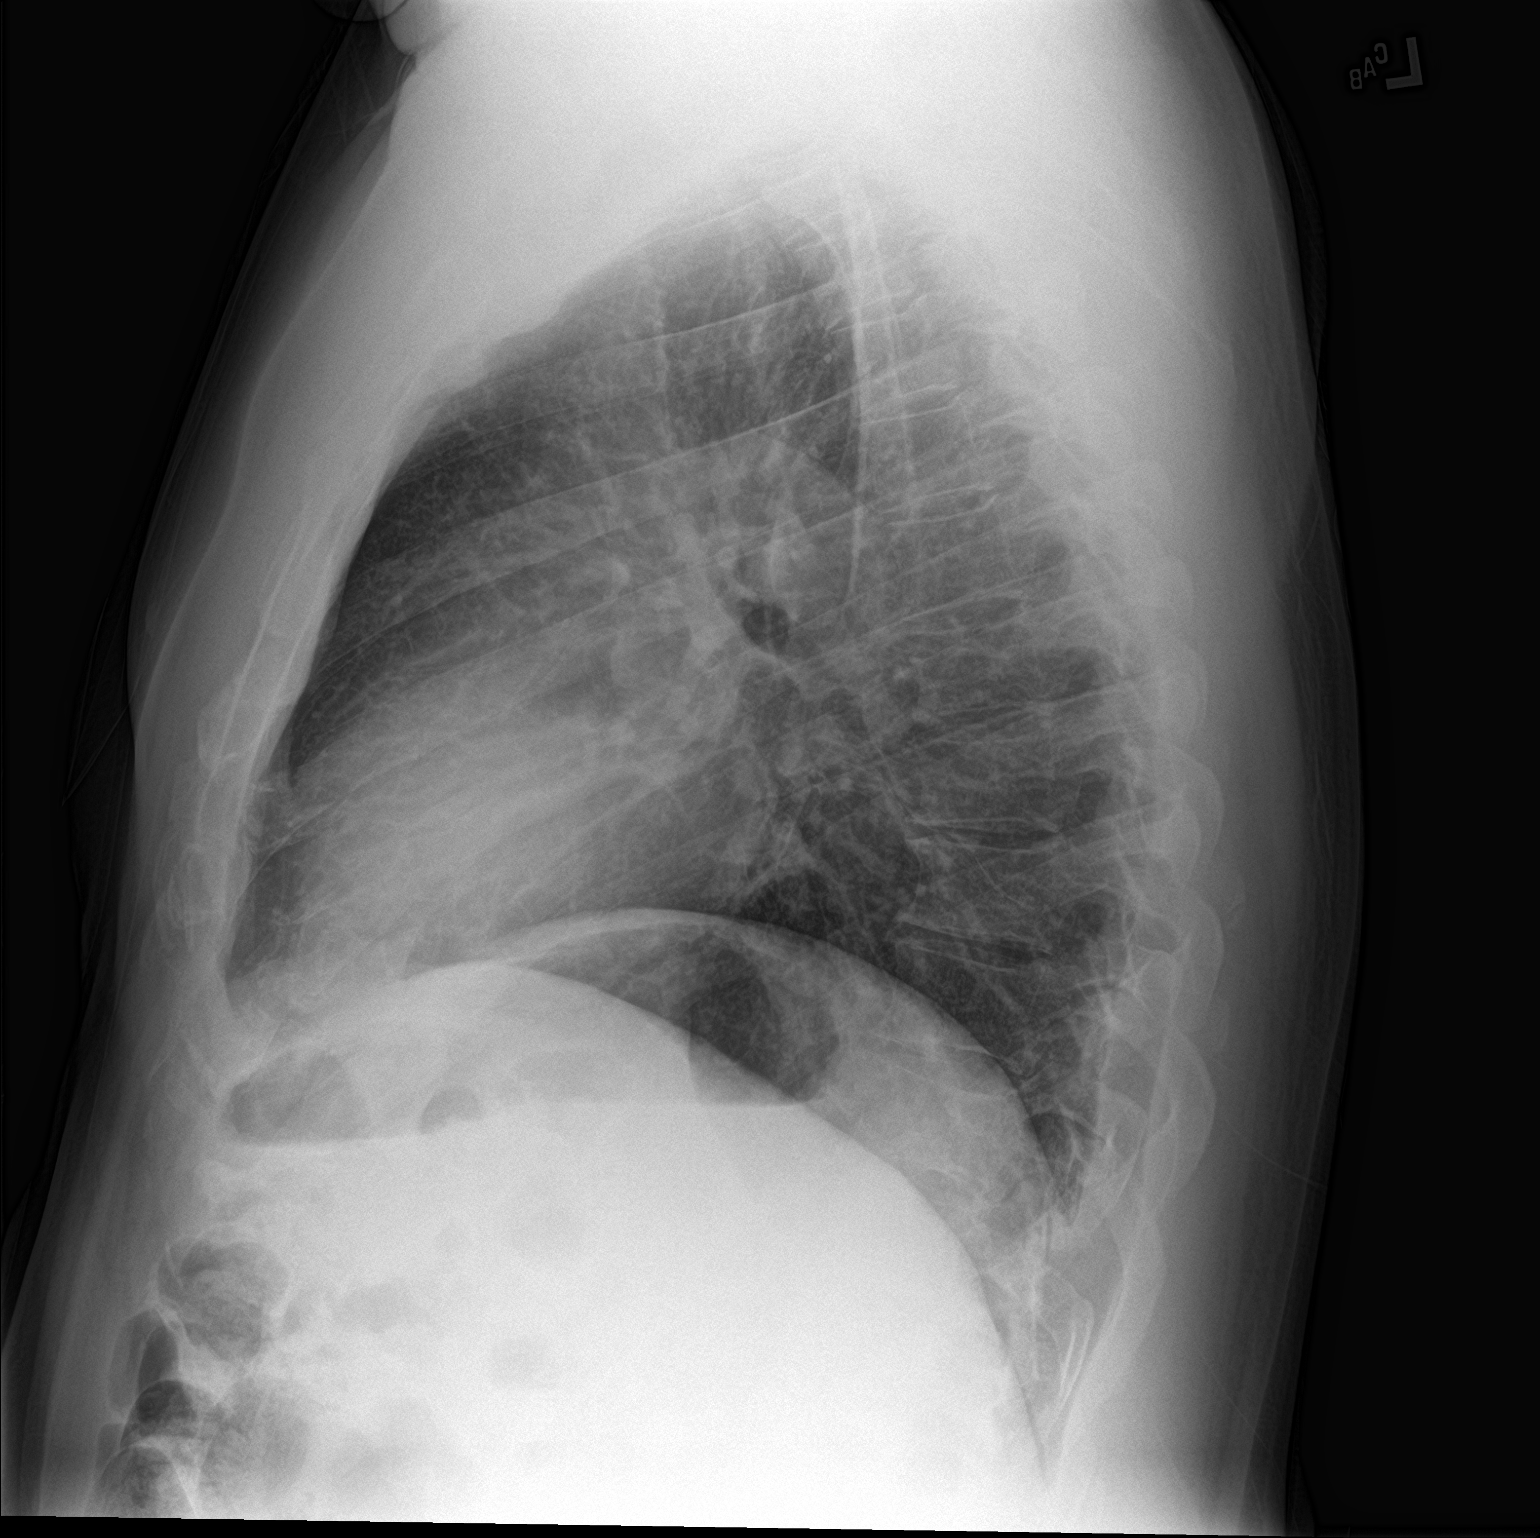

[2 of 2 positions shown; findings below may reference images not displayed]

FINDINGS: The cardiomediastinal silhouette is unremarkable.

Mild interstitial prominence is noted.

There is no evidence of focal airspace disease, pulmonary edema,
suspicious pulmonary nodule/mass, pleural effusion, or pneumothorax.
No acute bony abnormalities are identified.
IMPRESSION: Mild interstitial prominence -likely chronic.

No other significant abnormalities.

## 2018-06-23 ENCOUNTER — Other Ambulatory Visit: Payer: Self-pay | Admitting: Primary Care

## 2018-09-27 ENCOUNTER — Other Ambulatory Visit: Payer: Self-pay | Admitting: Primary Care

## 2018-10-21 ENCOUNTER — Other Ambulatory Visit: Payer: Self-pay | Admitting: Primary Care

## 2018-11-29 ENCOUNTER — Telehealth: Payer: Self-pay

## 2018-11-29 DIAGNOSIS — I10 Essential (primary) hypertension: Secondary | ICD-10-CM

## 2018-11-29 MED ORDER — AMLODIPINE BESY-BENAZEPRIL HCL 5-40 MG PO CAPS: 1.0000 | ORAL_CAPSULE | Freq: Every day | ORAL | 0 refills | Status: DC

## 2018-11-29 NOTE — Telephone Encounter (Signed)
Pt took last amlodipine-benzapril on 11/28/18. Pt does not have a way to take his BP; pt having no symptoms, CP,SOB,H/A or dizziness. Pt last seen 08/25/2016. No fever, cough,SOB and no known exposure to +covid or flu.no travel in 14 days. [facility] [employer]. Pt said when his wife gets back home he may be able to do virtual visit or does [name] want pt to come to office for FU. Pt request cb.

## 2018-11-29 NOTE — Telephone Encounter (Signed)
Patient will need an in office visit for follow-up and labs.  Have him come fasting.  I will send in a 2-week supply of his medication, after that I will no longer be able to provide refills unless he is seen.

## 2018-12-06 ENCOUNTER — Other Ambulatory Visit: Payer: Self-pay | Admitting: Primary Care

## 2018-12-06 DIAGNOSIS — I10 Essential (primary) hypertension: Secondary | ICD-10-CM

## 2018-12-06 NOTE — Telephone Encounter (Signed)
Lvm asking pt to call office 

## 2018-12-07 ENCOUNTER — Encounter: Payer: Self-pay | Admitting: Primary Care

## 2018-12-07 ENCOUNTER — Other Ambulatory Visit: Payer: Self-pay

## 2018-12-07 ENCOUNTER — Ambulatory Visit: Payer: BC Managed Care – PPO | Admitting: Primary Care

## 2018-12-07 ENCOUNTER — Other Ambulatory Visit: Payer: Self-pay | Admitting: Primary Care

## 2018-12-07 VITALS — BP 122/82 | HR 77 | Temp 98.3°F | Ht 73.0 in | Wt 290.2 lb

## 2018-12-07 DIAGNOSIS — E781 Pure hyperglyceridemia: Secondary | ICD-10-CM

## 2018-12-07 DIAGNOSIS — G4733 Obstructive sleep apnea (adult) (pediatric): Secondary | ICD-10-CM | POA: Diagnosis not present

## 2018-12-07 DIAGNOSIS — I10 Essential (primary) hypertension: Secondary | ICD-10-CM

## 2018-12-07 DIAGNOSIS — E782 Mixed hyperlipidemia: Secondary | ICD-10-CM

## 2018-12-07 LAB — COMPREHENSIVE METABOLIC PANEL
ALT: 31 U/L (ref 0–53)
AST: 24 U/L (ref 0–37)
Albumin: 4.6 g/dL (ref 3.5–5.2)
Alkaline Phosphatase: 68 U/L (ref 39–117)
BUN: 15 mg/dL (ref 6–23)
CO2: 28 mEq/L (ref 19–32)
Calcium: 9.6 mg/dL (ref 8.4–10.5)
Chloride: 103 mEq/L (ref 96–112)
Creatinine, Ser: 1.03 mg/dL (ref 0.40–1.50)
GFR: 77.5 mL/min (ref >60.00)
Glucose, Bld: 99 mg/dL (ref 70–99)
Potassium: 4.2 mEq/L (ref 3.5–5.1)
Sodium: 139 mEq/L (ref 135–145)
Total Bilirubin: 0.7 mg/dL (ref 0.2–1.2)
Total Protein: 7.4 g/dL (ref 6.0–8.3)

## 2018-12-07 LAB — LIPID PANEL
Cholesterol: 190 mg/dL (ref 0–200)
HDL: 26.8 mg/dL — ABNORMAL LOW (ref >39.00)
LDL Cholesterol: 128 mg/dL — ABNORMAL HIGH (ref 0–99)
NonHDL: 163.67
Total CHOL/HDL Ratio: 7
Triglycerides: 177 mg/dL — ABNORMAL HIGH (ref 0.0–149.0)
VLDL: 35.4 mg/dL (ref 0.0–40.0)

## 2018-12-07 LAB — HEMOGLOBIN A1C: Hgb A1c MFr Bld: 5.2 % (ref 4.6–6.5)

## 2018-12-07 MED ORDER — AMLODIPINE BESY-BENAZEPRIL HCL 5-40 MG PO CAPS: 1.0000 | ORAL_CAPSULE | Freq: Every day | ORAL | 3 refills | Status: DC

## 2018-12-07 MED ORDER — ATORVASTATIN CALCIUM 20 MG PO TABS: ORAL_TABLET | ORAL | 3 refills | Status: DC

## 2018-12-07 NOTE — Patient Instructions (Addendum)
Stop by the lab prior to leaving today. I will notify you of your results once received.   You will be contacted regarding your referral to pulmonology for sleep apnea.  Please let us know if you have not been contacted within one week.   Start exercising. You should be getting 150 minutes of moderate intensity exercise weekly.  It's important to improve your diet by reducing consumption of fast food, fried food, processed snack foods, sugary drinks. Increase consumption of fresh vegetables and fruits, whole grains, water.  Ensure you are drinking 64 ounces of water daily.  It was a pleasure to see you today!

## 2018-12-07 NOTE — Progress Notes (Signed)
Subjective:    Patient ID: Grant Keller, male    DOB: [birth year], 48 y.o.   MRN: [id number]  HPI  Grant Keller is a 48 year old male who presents today for follow up and medication refills.  1) Essential Hypertension: Currently managed on amlodipine-benazepril 5-40 mg daily. He denies chest pain, dizziness, shortness of breath.   BP Readings from Last 3 Encounters:  12/07/18 122/82  12/26/16 116/90  08/25/16 122/82   2) Hyperlipidemia: Currently managed on atorvastatin 20 mg. His last lipid panel was in May of 2018 with TC of 126, Trigs of 121, LDL of 70. He ran out of his atorvastatin about one year ago.   3) OSA: Not using CPAP machine as he feels like he "gasps" and feels like "someone's on me". He stopped using this about one year ago. He now snores at night when on his back, also some daytime drowsiness/sleepiness.   Review of Systems  Constitutional: Negative for unexpected weight change.  Respiratory: Negative for shortness of breath.        Snoring   Cardiovascular: Negative for chest pain.  Neurological: Negative for dizziness and headaches.       Past Medical History:  Diagnosis Date  . Allergy   . Hypertension   . Kidney stone      Social History   Socioeconomic History  . Marital status: Married    Spouse name: Not on file  . Number of children: Not on file  . Years of education: Not on file  . Highest education level: Not on file  Occupational History  . Not on file  Social Needs  . Financial resource strain: Not on file  . Food insecurity:    Worry: Not on file    Inability: Not on file  . Transportation needs:    Medical: Not on file    Non-medical: Not on file  Tobacco Use  . Smoking status: Never Smoker  . Smokeless tobacco: Never Used  . Tobacco comment: used to chew tobacco  Substance and Sexual Activity  . Alcohol use: Yes    Alcohol/week: 0.0 standard drinks    Comment: social  . Drug use: Not on file  . Sexual activity: Not on file   Lifestyle  . Physical activity:    Days per week: Not on file    Minutes per session: Not on file  . Stress: Not on file  Relationships  . Social connections:    Talks on phone: Not on file    Gets together: Not on file    Attends religious service: Not on file    Active member of club or organization: Not on file    Attends meetings of clubs or organizations: Not on file    Relationship status: Not on file  . Intimate partner violence:    Fear of current or ex partner: Not on file    Emotionally abused: Not on file    Physically abused: Not on file    Forced sexual activity: Not on file  Other Topics Concern  . Not on file  Social History Narrative   Married.   1 son.   Works in [employer].    Enjoys coaching baseball team, playing softball.    No past surgical history on file.  Family History  Problem Relation Age of Onset  . Heart disease Father   . Hypertension Father   . Heart attack Father   . Aplastic anemia Father  Allergies  Allergen Reactions  . Naproxen Hives    Current Outpatient Medications on File Prior to Visit  Medication Sig Dispense Refill  . amLODipine-benazepril (LOTREL) 5-40 MG capsule Take 1 capsule by mouth daily. For blood pressure. NEED APPOINTMENT FOR ANY MORE REFILLS. Final warning. 14 capsule 0  . atorvastatin (LIPITOR) 20 MG tablet TAKE 1 TABLET BY MOUTH EVERY EVENING AT BEDTIME. (Patient not taking: Reported on 12/07/2018) 90 tablet 0   No current facility-administered medications on file prior to visit.     BP 122/82   Pulse 77   Temp 98.3 F (36.8 C) (Oral)   Ht 6\' 1"  (1.854 m)   Wt 290 lb 4 oz (131.7 kg)   SpO2 98%   BMI 38.29 kg/m    Objective:   Physical Exam  Constitutional: He appears well-nourished.  Neck: Neck supple.  Cardiovascular: Normal rate and regular rhythm.  Respiratory: Effort normal and breath sounds normal.  Skin: Skin is warm and dry.  Psychiatric: He has a normal mood and affect.            Assessment & Plan:

## 2018-12-07 NOTE — Assessment & Plan Note (Signed)
Not using CPAP machine due to intolerance.  Referral placed to pulmonology for further evaluation.

## 2018-12-07 NOTE — Assessment & Plan Note (Signed)
No use of atorvastatin for one year, repeat lipids pending. Will send refills once we receive labs. Discussed the importance of a healthy diet and regular exercise in order for weight loss, and to reduce the risk of any potential medical problems.

## 2018-12-07 NOTE — Assessment & Plan Note (Signed)
Stable in the office today on current regimen, continue same. BMP pending. Refills sent to pharmacy.

## 2018-12-08 ENCOUNTER — Encounter: Payer: Self-pay | Admitting: Primary Care

## 2019-02-12 NOTE — Telephone Encounter (Signed)
Pt had appt on 12/07/18.

## 2019-05-29 ENCOUNTER — Ambulatory Visit: Payer: BC Managed Care – PPO | Admitting: Primary Care

## 2019-05-30 ENCOUNTER — Other Ambulatory Visit: Payer: Self-pay

## 2019-05-30 ENCOUNTER — Ambulatory Visit: Payer: BC Managed Care – PPO | Admitting: Primary Care

## 2019-05-30 ENCOUNTER — Encounter: Payer: Self-pay | Admitting: Primary Care

## 2019-05-30 VITALS — BP 116/80 | HR 82 | Temp 97.5°F | Ht 73.0 in | Wt 300.5 lb

## 2019-05-30 DIAGNOSIS — R109 Unspecified abdominal pain: Secondary | ICD-10-CM | POA: Insufficient documentation

## 2019-05-30 DIAGNOSIS — R1013 Epigastric pain: Secondary | ICD-10-CM | POA: Diagnosis not present

## 2019-05-30 HISTORY — DX: Unspecified abdominal pain: R10.9

## 2019-05-30 LAB — COMPREHENSIVE METABOLIC PANEL
ALT: 25 U/L (ref 0–53)
AST: 19 U/L (ref 0–37)
Albumin: 4.6 g/dL (ref 3.5–5.2)
Alkaline Phosphatase: 83 U/L (ref 39–117)
BUN: 15 mg/dL (ref 6–23)
CO2: 29 mEq/L (ref 19–32)
Calcium: 9.6 mg/dL (ref 8.4–10.5)
Chloride: 104 mEq/L (ref 96–112)
Creatinine, Ser: 0.89 mg/dL (ref 0.40–1.50)
GFR: 91.54 mL/min (ref >60.00)
Glucose, Bld: 106 mg/dL — ABNORMAL HIGH (ref 70–99)
Potassium: 4.6 mEq/L (ref 3.5–5.1)
Sodium: 140 mEq/L (ref 135–145)
Total Bilirubin: 0.7 mg/dL (ref 0.2–1.2)
Total Protein: 7.1 g/dL (ref 6.0–8.3)

## 2019-05-30 LAB — CBC
HCT: 45.8 % (ref 39.0–52.0)
Hemoglobin: 15.6 g/dL (ref 13.0–17.0)
MCHC: 34 g/dL (ref 30.0–36.0)
MCV: 90.5 fl (ref 78.0–100.0)
Platelets: 233 10*3/uL (ref 150.0–400.0)
RBC: 5.07 Mil/uL (ref 4.22–5.81)
RDW: 13.1 % (ref 11.5–15.5)
WBC: 10.4 10*3/uL (ref 4.0–10.5)

## 2019-05-30 LAB — CK: Total CK: 102 U/L (ref 7–232)

## 2019-05-30 LAB — LIPASE: Lipase: 31 U/L (ref 11.0–59.0)

## 2019-05-30 MED ORDER — OMEPRAZOLE 20 MG PO CPDR: 20.0000 mg | DELAYED_RELEASE_CAPSULE | Freq: Every day | ORAL | 0 refills | Status: DC

## 2019-05-30 NOTE — Assessment & Plan Note (Signed)
Intermittently, also with meals.  No other GERD symptoms. Could be hiatal hernia.  Check labs including CBC, CMP, Lipase. Rx for omeprazole 20 mg provided. He will update.

## 2019-05-30 NOTE — Patient Instructions (Signed)
Stop by the lab prior to leaving today. I will notify you of your results once received.   Start omeprazole 20 mg capsules once nightly for the fullness/bulge in the upper abdomen.   It was a pleasure to see you today!

## 2019-05-30 NOTE — Assessment & Plan Note (Addendum)
Unclear etiology, no other associated symptoms. He has no back or neck pain, no numbness, no weakness. He does not appear sickly or in distress.  Differentials include diverticulitis, GERD, pancreatitis, muscle spasms, muscular disorder, statin. Likely statin as he began this medication in April 2020 after being off for one year.   Start with labs today including Lipase, CBC, CMP. Consider GI referral for endoscopy for epigastric fullness.  Await results. Consider switching from Lipitor to Crestor.

## 2019-05-30 NOTE — Progress Notes (Signed)
Subjective:    Patient ID: Grant Keller, male    DOB: 1971/08/10, 48 y.o.   MRN: [id number]  HPI  Grant Keller is a 48 year old male with a history of hypertension, sleep apnea, hypertriglyceridemia, obesity who presents today with a chief complaint of muscle spasms.  His spasms are located to the LLQ, RLQ, and epigastric region.  About 6 months ago he was in his laying back in his recliner stretching and suddenly felt his upper abdomen "lock up". This lasted for one minute and couldn't stand straight due to discomfort.   Since then he's noticed intermittent abdominal cramping/pulling/tightness that begins to the LLQ with radiation to RLQ and mid epigastric region that will last for up to one minute with residual soreness the following day. Also during spasms he will expereince discomfort with deep inspiration During spasm occurrence he will palpate a hard, non tender "bulge" to the epigastric region, will also notice the bulge after meals. He feels like his food will "get stuck" to the epigastric region.   Over the last week he's noticed two of these episodes during the night that have woken him from sleep. During the day he can feel the spasms coming and can sometimes prevent them from occurring.   He denies constipation, diarrhea, nausea/vomiting, esophageal burning, belching. He does not wear his CPAP at night. He has not taken anything for symptoms.   Review of Systems  Constitutional: Negative for fever.  Eyes: Negative for visual disturbance.  Respiratory: Negative for shortness of breath.   Cardiovascular: Negative for chest pain.  Gastrointestinal: Negative for abdominal pain, constipation, diarrhea, nausea and vomiting.  Genitourinary: Negative for dysuria and hematuria.  Musculoskeletal: Positive for myalgias. Negative for back pain and neck pain.  Neurological: Negative for dizziness, weakness, numbness and headaches.       Past Medical History:  Diagnosis Date  . Allergy    . Hypertension   . Kidney stone      Social History   Socioeconomic History  . Marital status: Married    Spouse name: Not on file  . Number of children: Not on file  . Years of education: Not on file  . Highest education level: Not on file  Occupational History  . Not on file  Social Needs  . Financial resource strain: Not on file  . Food insecurity    Worry: Not on file    Inability: Not on file  . Transportation needs    Medical: Not on file    Non-medical: Not on file  Tobacco Use  . Smoking status: Never Smoker  . Smokeless tobacco: Never Used  . Tobacco comment: used to chew tobacco  Substance and Sexual Activity  . Alcohol use: Yes    Alcohol/week: 0.0 standard drinks    Comment: social  . Drug use: Not on file  . Sexual activity: Not on file  Lifestyle  . Physical activity    Days per week: Not on file    Minutes per session: Not on file  . Stress: Not on file  Relationships  . Social Herbalist on phone: Not on file    Gets together: Not on file    Attends religious service: Not on file    Active member of club or organization: Not on file    Attends meetings of clubs or organizations: Not on file    Relationship status: Not on file  . Intimate partner violence    Fear  of current or ex partner: Not on file    Emotionally abused: Not on file    Physically abused: Not on file    Forced sexual activity: Not on file  Other Topics Concern  . Not on file  Social History Narrative   Married.   1 son.   Works in [employer].    Enjoys coaching baseball team, playing softball.    No past surgical history on file.  Family History  Problem Relation Age of Onset  . Heart disease Father   . Hypertension Father   . Heart attack Father   . Aplastic anemia Father     Allergies  Allergen Reactions  . Naproxen Hives    Current Outpatient Medications on File Prior to Visit  Medication Sig Dispense Refill  . amLODipine-benazepril (LOTREL)  5-40 MG capsule Take 1 capsule by mouth daily. For blood pressure. 90 capsule 3  . atorvastatin (LIPITOR) 20 MG tablet Take 1 tablet by mouth daily for cholesterol. 90 tablet 3   No current facility-administered medications on file prior to visit.     BP 116/80   Pulse 82   Temp (!) 97.5 F (36.4 C) (Temporal)   Ht 6\' 1"  (1.854 m)   Wt (!) 300 lb 8 oz (136.3 kg)   SpO2 97%   BMI 39.65 kg/m    Objective:   Physical Exam  Constitutional: He appears well-nourished.  Cardiovascular: Normal rate and regular rhythm.  Respiratory: Effort normal and breath sounds normal.  GI: Soft. Normal appearance and bowel sounds are normal. There is abdominal tenderness in the right lower quadrant, epigastric area and left lower quadrant. There is no guarding.    Musculoskeletal: Normal range of motion.  Skin: Skin is warm and dry.           Assessment & Plan:

## 2019-06-13 ENCOUNTER — Other Ambulatory Visit: Payer: Self-pay | Admitting: Primary Care

## 2019-06-13 DIAGNOSIS — R1013 Epigastric pain: Secondary | ICD-10-CM

## 2019-07-26 ENCOUNTER — Telehealth: Payer: Self-pay | Admitting: Primary Care

## 2019-07-26 NOTE — Telephone Encounter (Signed)
Patient was suppose to update [name] in November after he stopped the Atorvastatin.  Patient forgot.  Patient said he's doing great.  Patient said all the body lock ups went away when he stopped the medication. Patient uses [facility] [address].

## 2019-07-26 NOTE — Telephone Encounter (Signed)
Noted. Will remove from medication list.

## 2020-02-15 ENCOUNTER — Other Ambulatory Visit: Payer: Self-pay | Admitting: Primary Care

## 2020-02-15 DIAGNOSIS — I10 Essential (primary) hypertension: Secondary | ICD-10-CM

## 2020-05-18 ENCOUNTER — Other Ambulatory Visit: Payer: Self-pay | Admitting: Primary Care

## 2020-05-18 DIAGNOSIS — I10 Essential (primary) hypertension: Secondary | ICD-10-CM

## 2020-10-04 ENCOUNTER — Other Ambulatory Visit: Payer: Self-pay | Admitting: Primary Care

## 2020-10-04 DIAGNOSIS — I10 Essential (primary) hypertension: Secondary | ICD-10-CM

## 2020-10-10 ENCOUNTER — Ambulatory Visit: Payer: BC Managed Care – PPO | Admitting: Primary Care

## 2020-10-10 ENCOUNTER — Encounter: Payer: Self-pay | Admitting: Primary Care

## 2020-10-10 ENCOUNTER — Other Ambulatory Visit: Payer: Self-pay

## 2020-10-10 VITALS — BP 126/80 | HR 78 | Temp 97.6°F | Ht 73.0 in | Wt 308.0 lb

## 2020-10-10 DIAGNOSIS — Z0000 Encounter for general adult medical examination without abnormal findings: Secondary | ICD-10-CM

## 2020-10-10 DIAGNOSIS — R109 Unspecified abdominal pain: Secondary | ICD-10-CM

## 2020-10-10 DIAGNOSIS — I10 Essential (primary) hypertension: Secondary | ICD-10-CM | POA: Diagnosis not present

## 2020-10-10 DIAGNOSIS — Z23 Encounter for immunization: Secondary | ICD-10-CM

## 2020-10-10 DIAGNOSIS — L309 Dermatitis, unspecified: Secondary | ICD-10-CM | POA: Diagnosis not present

## 2020-10-10 DIAGNOSIS — E781 Pure hyperglyceridemia: Secondary | ICD-10-CM | POA: Diagnosis not present

## 2020-10-10 DIAGNOSIS — G4733 Obstructive sleep apnea (adult) (pediatric): Secondary | ICD-10-CM

## 2020-10-10 DIAGNOSIS — Z1159 Encounter for screening for other viral diseases: Secondary | ICD-10-CM

## 2020-10-10 DIAGNOSIS — Z114 Encounter for screening for human immunodeficiency virus [HIV]: Secondary | ICD-10-CM

## 2020-10-10 DIAGNOSIS — Z1211 Encounter for screening for malignant neoplasm of colon: Secondary | ICD-10-CM

## 2020-10-10 LAB — COMPREHENSIVE METABOLIC PANEL
ALT: 27 U/L (ref 0–53)
AST: 20 U/L (ref 0–37)
Albumin: 4.6 g/dL (ref 3.5–5.2)
Alkaline Phosphatase: 86 U/L (ref 39–117)
BUN: 12 mg/dL (ref 6–23)
CO2: 31 mEq/L (ref 19–32)
Calcium: 9.4 mg/dL (ref 8.4–10.5)
Chloride: 102 mEq/L (ref 96–112)
Creatinine, Ser: 1.01 mg/dL (ref 0.40–1.50)
GFR: 87.9 mL/min (ref >60.00)
Glucose, Bld: 117 mg/dL — ABNORMAL HIGH (ref 70–99)
Potassium: 4.4 mEq/L (ref 3.5–5.1)
Sodium: 139 mEq/L (ref 135–145)
Total Bilirubin: 0.6 mg/dL (ref 0.2–1.2)
Total Protein: 7.2 g/dL (ref 6.0–8.3)

## 2020-10-10 LAB — CBC
HCT: 45.9 % (ref 39.0–52.0)
Hemoglobin: 15.6 g/dL (ref 13.0–17.0)
MCHC: 33.9 g/dL (ref 30.0–36.0)
MCV: 87.7 fl (ref 78.0–100.0)
Platelets: 206 10*3/uL (ref 150.0–400.0)
RBC: 5.24 Mil/uL (ref 4.22–5.81)
RDW: 13.3 % (ref 11.5–15.5)
WBC: 8.7 10*3/uL (ref 4.0–10.5)

## 2020-10-10 LAB — LIPID PANEL
Cholesterol: 174 mg/dL (ref 0–200)
HDL: 27 mg/dL — ABNORMAL LOW (ref >39.00)
LDL Cholesterol: 119 mg/dL — ABNORMAL HIGH (ref 0–99)
NonHDL: 147.48
Total CHOL/HDL Ratio: 6
Triglycerides: 142 mg/dL (ref 0.0–149.0)
VLDL: 28.4 mg/dL (ref 0.0–40.0)

## 2020-10-10 LAB — HEMOGLOBIN A1C: Hgb A1c MFr Bld: 5.4 % (ref 4.6–6.5)

## 2020-10-10 MED ORDER — AMLODIPINE BESY-BENAZEPRIL HCL 5-40 MG PO CAPS: 1.0000 | ORAL_CAPSULE | Freq: Every day | ORAL | 3 refills | Status: DC

## 2020-10-10 NOTE — Assessment & Plan Note (Signed)
Resolved when statin therapy was removed. No symptoms since.

## 2020-10-10 NOTE — Assessment & Plan Note (Signed)
Controlled in the office today, continue amlodipine-benazepril 5-40 mg daily. Continue same.

## 2020-10-10 NOTE — Progress Notes (Signed)
Subjective:    Patient ID: Grant Keller, male    DOB: February 16, 1971, 50 y.o.   MRN: [id number]  HPI  This visit occurred during the SARS-CoV-2 public health emergency.  Safety protocols were in place, including screening questions prior to the visit, additional usage of staff PPE, and extensive cleaning of exam room while observing appropriate contact time as indicated for disinfecting solutions.   Grant Keller is a 50 year old male who presents today for complete physical and follow up of chronic conditions.   Immunizations: -Tetanus: Due -Influenza: Declines  -Covid-19: Completed two vaccines  Diet: He endorses an improved diet.  Exercise: He is not exercising.   Eye exam: Due, he will schedule Dental exam: Completes semi-annually   Colonoscopy: Never completed   BP Readings from Last 3 Encounters:  10/10/20 126/80  05/30/19 116/80  12/07/18 122/82   Wt Readings from Last 3 Encounters:  10/10/20 (!) 308 lb (139.7 kg)  05/30/19 (!) 300 lb 8 oz (136.3 kg)  12/07/18 290 lb 4 oz (131.7 kg)      Review of Systems  Constitutional: Negative for unexpected weight change.  HENT: Negative for rhinorrhea.   Eyes: Negative for visual disturbance.  Respiratory: Negative for cough and shortness of breath.   Cardiovascular: Negative for chest pain.  Gastrointestinal: Negative for constipation and diarrhea.  Genitourinary: Negative for difficulty urinating.  Musculoskeletal: Negative for arthralgias.  Skin: Positive for rash.       Chronic, intermittent eczema rash.  Allergic/Immunologic: Negative for environmental allergies.  Neurological: Negative for dizziness, numbness and headaches.  Psychiatric/Behavioral: The patient is not nervous/anxious.        Past Medical History:  Diagnosis Date  . Abdominal spasms 05/30/2019  . Allergy   . Hypertension   . Kidney stone      Social History   Socioeconomic History  . Marital status: Married    Spouse name: Not on file  .  Number of children: Not on file  . Years of education: Not on file  . Highest education level: Not on file  Occupational History  . Not on file  Tobacco Use  . Smoking status: Never Smoker  . Smokeless tobacco: Never Used  . Tobacco comment: used to chew tobacco  Substance and Sexual Activity  . Alcohol use: Yes    Alcohol/week: 0.0 standard drinks    Comment: social  . Drug use: Not on file  . Sexual activity: Not on file  Other Topics Concern  . Not on file  Social History Narrative   Married.   1 son.   Works in [employer].    Enjoys coaching baseball team, playing softball.   Social Determinants of Health   Financial Resource Strain: Not on file  Food Insecurity: Not on file  Transportation Needs: Not on file  Physical Activity: Not on file  Stress: Not on file  Social Connections: Not on file  Intimate Partner Violence: Not on file    History reviewed. No pertinent surgical history.  Family History  Problem Relation Age of Onset  . Heart disease Father   . Hypertension Father   . Heart attack Father   . Aplastic anemia Father     Allergies  Allergen Reactions  . Naproxen Hives    Current Outpatient Medications on File Prior to Visit  Medication Sig Dispense Refill  . amLODipine-benazepril (LOTREL) 5-40 MG capsule TAKE 1 CAPSULE BY MOUTH DAILY. FOR BLOOD PRESSURE. 90 capsule 0   No current facility-administered  medications on file prior to visit.    BP 126/80   Pulse 78   Temp 97.6 F (36.4 C) (Temporal)   Ht 6\' 1"  (1.854 m)   Wt (!) 308 lb (139.7 kg)   SpO2 97%   BMI 40.64 kg/m    Objective:   Physical Exam Constitutional:      Appearance: He is well-nourished.  HENT:     Right Ear: Tympanic membrane and ear canal normal.     Left Ear: Tympanic membrane and ear canal normal.     Mouth/Throat:     Mouth: Oropharynx is clear and moist.  Eyes:     Extraocular Movements: EOM normal.     Pupils: Pupils are equal, round, and reactive to  light.  Cardiovascular:     Rate and Rhythm: Normal rate and regular rhythm.  Pulmonary:     Effort: Pulmonary effort is normal.     Breath sounds: Normal breath sounds.  Abdominal:     General: Bowel sounds are normal.     Palpations: Abdomen is soft.     Tenderness: There is no abdominal tenderness.  Musculoskeletal:        General: Normal range of motion.     Cervical back: Neck supple.  Skin:    General: Skin is warm and dry.  Neurological:     Mental Status: He is alert and oriented to person, place, and time.     Cranial Nerves: No cranial nerve deficit.     Deep Tendon Reflexes:     Reflex Scores:      Patellar reflexes are 2+ on the right side and 2+ on the left side. Psychiatric:        Mood and Affect: Mood and affect and mood normal.            Assessment & Plan:

## 2020-10-10 NOTE — Assessment & Plan Note (Signed)
Not using CPAP machine, doesn't prefer to use due to inability to tolerate.   Offered pulmonology evaluation for which he kindly declines. We discussed the absolute need for weight loss through diet and exercise. He is motivated and will work on these efforts.  Also discussed long term effects of uncontrolled OSA.

## 2020-10-10 NOTE — Assessment & Plan Note (Signed)
Could not tolerate statin therapy due to myalgias.  Discussed the absolute need to work on weight loss.  Consider Zetia if needed.

## 2020-10-10 NOTE — Patient Instructions (Addendum)
Stop by the lab prior to leaving today. I will notify you of your results once received.   Start exercising. You should be getting 150 minutes of moderate intensity exercise weekly.  It's important to improve your diet by reducing consumption of fast food, fried food, processed snack foods, sugary drinks. Increase consumption of fresh vegetables and fruits, whole grains, water.  Ensure you are drinking 64 ounces of water daily.  You will be contacted regarding your referral to GI for the colonoscopy.  Please let us know if you have not been contacted within two weeks.   It was a pleasure to see you today!   Preventive Care 30-72 Years Old, Male Preventive care refers to lifestyle choices and visits with your health care provider that can promote health and wellness. This includes:  A yearly physical exam. This is also called an annual wellness visit.  Regular dental and eye exams.  Immunizations.  Screening for certain conditions.  Healthy lifestyle choices, such as: ? Eating a healthy diet. ? Getting regular exercise. ? Not using drugs or products that contain nicotine and tobacco. ? Limiting alcohol use. What can I expect for my preventive care visit? Physical exam Your health care provider will check your:  Height and weight. These may be used to calculate your BMI (body mass index). BMI is a measurement that tells if you are at a healthy weight.  Heart rate and blood pressure.  Body temperature.  Skin for abnormal spots. Counseling Your health care provider may ask you questions about your:  Past medical problems.  Family's medical history.  Alcohol, tobacco, and drug use.  Emotional well-being.  Home life and relationship well-being.  Sexual activity.  Diet, exercise, and sleep habits.  Work and work Statistician.  Access to firearms. What immunizations do I need? Vaccines are usually given at various ages, according to a schedule. Your health care  provider will recommend vaccines for you based on your age, medical history, and lifestyle or other factors, such as travel or where you work.   What tests do I need? Blood tests  Lipid and cholesterol levels. These may be checked every 5 years, or more often if you are over 67 years old.  Hepatitis C test.  Hepatitis B test. Screening  Lung cancer screening. You may have this screening every year starting at age 68 if you have a 30-pack-year history of smoking and currently smoke or have quit within the past 15 years.  Prostate cancer screening. Recommendations will vary depending on your family history and other risks.  Genital exam to check for testicular cancer or hernias.  Colorectal cancer screening. ? All adults should have this screening starting at age 67 and continuing until age 25. ? Your health care provider may recommend screening at age 9 if you are at increased risk. ? You will have tests every 1-10 years, depending on your results and the type of screening test.  Diabetes screening. ? This is done by checking your blood sugar (glucose) after you have not eaten for a while (fasting). ? You may have this done every 1-3 years.  STD (sexually transmitted disease) testing, if you are at risk. Follow these instructions at home: Eating and drinking  Eat a diet that includes fresh fruits and vegetables, whole grains, lean protein, and low-fat dairy products.  Take vitamin and mineral supplements as recommended by your health care provider.  Do not drink alcohol if your health care provider tells you not to drink.  If you drink alcohol: ? Limit how much you have to 0-2 drinks a day. ? Be aware of how much alcohol is in your drink. In the [place] one drink equals one 12 oz bottle of beer (355 mL), one 5 oz glass of wine (148 mL), or one 1 oz glass of hard liquor (44 mL).   Lifestyle  Take daily care of your teeth and gums. Brush your teeth every morning and night with  fluoride toothpaste. Floss one time each day.  Stay active. Exercise for at least 30 minutes 5 or more days each week.  Do not use any products that contain nicotine or tobacco, such as cigarettes, e-cigarettes, and chewing tobacco. If you need help quitting, ask your health care provider.  Do not use drugs.  If you are sexually active, practice safe sex. Use a condom or other form of protection to prevent STIs (sexually transmitted infections).  If told by your health care provider, take low-dose aspirin daily starting at age 11.  Find healthy ways to cope with stress, such as: ? Meditation, yoga, or listening to music. ? Journaling. ? Talking to a trusted person. ? Spending time with friends and family. Safety  Always wear your seat belt while driving or riding in a vehicle.  Do not drive: ? If you have been drinking alcohol. Do not ride with someone who has been drinking. ? When you are tired or distracted. ? While texting.  Wear a helmet and other protective equipment during sports activities.  If you have firearms in your house, make sure you follow all gun safety procedures. What's next?  Go to your health care provider once a year for an annual wellness visit.  Ask your health care provider how often you should have your eyes and teeth checked.  Stay up to date on all vaccines. This information is not intended to replace advice given to you by your health care provider. Make sure you discuss any questions you have with your health care provider. Document Revised: 05/08/2019 Document Reviewed: 08/03/2018 Elsevier Patient Education  2021 [employer].

## 2020-10-10 NOTE — Assessment & Plan Note (Signed)
Chronic occurring in the winter months. Overall doing well with daily regimen of OTC lotion.  Continue same.

## 2020-10-10 NOTE — Assessment & Plan Note (Signed)
Tetanus due, provided today. Declines influenza vaccine. Colonoscopy due, referral placed to GI.  Discussed the importance of a healthy diet and regular exercise in order for weight loss, and to reduce the risk of any potential medical problems.  Exam today stable. Labs pending.

## 2020-10-13 LAB — HEPATITIS C ANTIBODY
Hepatitis C Ab: NONREACTIVE
SIGNAL TO CUT-OFF: 0.02 (ref <1.00)

## 2020-10-13 LAB — HIV ANTIBODY (ROUTINE TESTING W REFLEX): HIV 1&2 Ab, 4th Generation: NONREACTIVE

## 2020-10-13 NOTE — Addendum Note (Signed)
Addended by: Francella Solian on: 10/13/2020 08:17 AM   Modules accepted: Orders

## 2020-10-20 ENCOUNTER — Encounter: Payer: Self-pay | Admitting: *Deleted

## 2020-10-20 ENCOUNTER — Other Ambulatory Visit: Payer: Self-pay

## 2020-10-20 ENCOUNTER — Telehealth: Payer: Self-pay

## 2020-10-20 ENCOUNTER — Telehealth (INDEPENDENT_AMBULATORY_CARE_PROVIDER_SITE_OTHER): Payer: Self-pay | Admitting: Gastroenterology

## 2020-10-20 DIAGNOSIS — Z1211 Encounter for screening for malignant neoplasm of colon: Secondary | ICD-10-CM

## 2020-10-20 MED ORDER — PEG 3350-KCL-NA BICARB-NACL 420 G PO SOLR: 4000.0000 mL | Freq: Once | ORAL | 0 refills | Status: AC

## 2020-10-20 NOTE — Progress Notes (Signed)
Gastroenterology Pre-Procedure Review  Request Date: Monday 11/10/20 Requesting Physician: Dr. Marius Ditch  PATIENT REVIEW QUESTIONS: The patient responded to the following health history questions as indicated:    1. Are you having any GI issues? Experienced stomach cramps when started taking cholesterol meds.  this has subsided since stopping the medication. 2. Do you have a personal history of Polyps? no 3. Do you have a family history of Colon Cancer or Polyps? no 4. Diabetes Mellitus? no 5. Joint replacements in the past 12 months?no 6. Major health problems in the past 3 months?no 7. Any artificial heart valves, MVP, or defibrillator?no    MEDICATIONS & ALLERGIES:    Patient reports the following regarding taking any anticoagulation/antiplatelet therapy:   Plavix, Coumadin, Eliquis, Xarelto, Lovenox, Pradaxa, Brilinta, or Effient? no Aspirin? no  Patient confirms/reports the following medications:  Current Outpatient Medications  Medication Sig Dispense Refill  . polyethylene glycol-electrolytes (NULYTELY) 420 g solution Take 4,000 mLs by mouth once for 1 dose. 4000 mL 0  . amLODipine-benazepril (LOTREL) 5-40 MG capsule Take 1 capsule by mouth daily. For blood pressure. 90 capsule 3   No current facility-administered medications for this visit.    Patient confirms/reports the following allergies:  Allergies  Allergen Reactions  . Naproxen Hives    No orders of the defined types were placed in this encounter.   AUTHORIZATION INFORMATION Primary Insurance: 1D#: Group #:  Secondary Insurance: 1D#: Group #:  SCHEDULE INFORMATION: Date: Monday 11/10/20 Time: Location:[facility]

## 2020-10-20 NOTE — Telephone Encounter (Signed)
Attempted to contact patient to complete colonoscopy triage and schedule colonoscopy.  He was not available to take the call.  Could not leave voice message because his voice mail was full.  Thanks,  [place], [place]

## 2020-11-06 ENCOUNTER — Other Ambulatory Visit: Payer: Self-pay

## 2020-11-06 ENCOUNTER — Other Ambulatory Visit
Admission: RE | Admit: 2020-11-06 | Discharge: 2020-11-06 | Disposition: A | Discharge status: Home / Self Care | Payer: BC Managed Care – PPO | Source: Ambulatory Visit | Attending: Gastroenterology | Admitting: Gastroenterology

## 2020-11-06 DIAGNOSIS — Z01812 Encounter for preprocedural laboratory examination: Secondary | ICD-10-CM | POA: Diagnosis not present

## 2020-11-06 DIAGNOSIS — Z20822 Contact with and (suspected) exposure to covid-19: Secondary | ICD-10-CM | POA: Diagnosis not present

## 2020-11-06 LAB — SARS CORONAVIRUS 2 (TAT 6-24 HRS): SARS Coronavirus 2: NEGATIVE

## 2020-11-07 ENCOUNTER — Encounter: Payer: Self-pay | Admitting: Gastroenterology

## 2020-11-10 ENCOUNTER — Other Ambulatory Visit: Payer: Self-pay

## 2020-11-10 ENCOUNTER — Ambulatory Visit: Payer: BC Managed Care – PPO | Admitting: Certified Registered"

## 2020-11-10 ENCOUNTER — Encounter: Payer: Self-pay | Admitting: Gastroenterology

## 2020-11-10 ENCOUNTER — Encounter
Admission: RE | Disposition: A | Discharge status: Home / Self Care | Payer: Self-pay | Source: Home / Self Care | Attending: Gastroenterology

## 2020-11-10 ENCOUNTER — Ambulatory Visit
Admission: RE | Admit: 2020-11-10 | Discharge: 2020-11-10 | Disposition: A | Discharge status: Home / Self Care | Payer: BC Managed Care – PPO | Attending: Gastroenterology | Admitting: Gastroenterology

## 2020-11-10 DIAGNOSIS — D175 Benign lipomatous neoplasm of intra-abdominal organs: Secondary | ICD-10-CM | POA: Diagnosis not present

## 2020-11-10 DIAGNOSIS — Z79899 Other long term (current) drug therapy: Secondary | ICD-10-CM | POA: Diagnosis not present

## 2020-11-10 DIAGNOSIS — I10 Essential (primary) hypertension: Secondary | ICD-10-CM | POA: Insufficient documentation

## 2020-11-10 DIAGNOSIS — D120 Benign neoplasm of cecum: Secondary | ICD-10-CM | POA: Insufficient documentation

## 2020-11-10 DIAGNOSIS — Z1211 Encounter for screening for malignant neoplasm of colon: Secondary | ICD-10-CM | POA: Diagnosis present

## 2020-11-10 DIAGNOSIS — Z886 Allergy status to analgesic agent status: Secondary | ICD-10-CM | POA: Insufficient documentation

## 2020-11-10 DIAGNOSIS — Z8249 Family history of ischemic heart disease and other diseases of the circulatory system: Secondary | ICD-10-CM | POA: Diagnosis not present

## 2020-11-10 HISTORY — PX: COLONOSCOPY WITH PROPOFOL: SHX5780

## 2020-11-10 SURGERY — COLONOSCOPY WITH PROPOFOL
Anesthesia: General

## 2020-11-10 MED ORDER — PROPOFOL 10 MG/ML IV BOLUS
INTRAVENOUS | Status: DC | PRN
  Administered 2020-11-10: 70 mg via INTRAVENOUS
  Administered 2020-11-10: 80 mg via INTRAVENOUS

## 2020-11-10 MED ORDER — DEXMEDETOMIDINE (PRECEDEX) IN NS 20 MCG/5ML (4 MCG/ML) IV SYRINGE
PREFILLED_SYRINGE | INTRAVENOUS | Status: DC | PRN
  Administered 2020-11-10: 12 ug via INTRAVENOUS
  Administered 2020-11-10: 8 ug via INTRAVENOUS

## 2020-11-10 MED ORDER — SODIUM CHLORIDE 0.9 % IV SOLN
INTRAVENOUS | Status: DC
  Administered 2020-11-10: 20 mL/h via INTRAVENOUS

## 2020-11-10 MED ORDER — LIDOCAINE HCL (CARDIAC) PF 100 MG/5ML IV SOSY
PREFILLED_SYRINGE | INTRAVENOUS | Status: DC | PRN
  Administered 2020-11-10: 50 mg via INTRAVENOUS

## 2020-11-10 MED ORDER — PROPOFOL 500 MG/50ML IV EMUL
INTRAVENOUS | Status: DC | PRN
  Administered 2020-11-10: 150 ug/kg/min via INTRAVENOUS

## 2020-11-10 NOTE — Op Note (Signed)
[facility] Gastroenterology Patient Name: Grant Keller Procedure Date: 11/10/2020 10:20 AM MRN: [id number] Account #: [id number] Date of Birth: [birth year] Admit Type: Outpatient Age: 50 Room: [facility] ENDO ROOM 2 Gender: Male Note Status: Finalized Procedure:             Colonoscopy Indications:           Screening for colorectal malignant neoplasm, This is                         the patient's first colonoscopy Providers:             Lin Landsman MD, MD Medicines:             General Anesthesia Complications:         No immediate complications. Estimated blood loss: None. Procedure:             Pre-Anesthesia Assessment:                        - Prior to the procedure, a History and Physical was                         performed, and patient medications and allergies were                         reviewed. The patient is competent. The risks and                         benefits of the procedure and the sedation options and                         risks were discussed with the patient. All questions                         were answered and informed consent was obtained.                         Patient identification and proposed procedure were                         verified by the physician, the nurse, the                         anesthesiologist, the anesthetist and the technician                         in the pre-procedure area in the procedure room in the                         endoscopy suite. Mental Status Examination: alert and                         oriented. Airway Examination: normal oropharyngeal                         airway and neck mobility. Respiratory Examination:                         clear to auscultation. CV Examination: normal.  Prophylactic Antibiotics: The patient does not require                         prophylactic antibiotics. Prior Anticoagulants: The                         patient has taken no previous  anticoagulant or                         antiplatelet agents. ASA Grade Assessment: III - A                         patient with severe systemic disease. After reviewing                         the risks and benefits, the patient was deemed in                         satisfactory condition to undergo the procedure. The                         anesthesia plan was to use general anesthesia.                         Immediately prior to administration of medications,                         the patient was re-assessed for adequacy to receive                         sedatives. The heart rate, respiratory rate, oxygen                         saturations, blood pressure, adequacy of pulmonary                         ventilation, and response to care were monitored                         throughout the procedure. The physical status of the                         patient was re-assessed after the procedure.                        After obtaining informed consent, the colonoscope was                         passed under direct vision. Throughout the procedure,                         the patient's blood pressure, pulse, and oxygen                         saturations were monitored continuously. The                         Colonoscope was introduced through the anus and  advanced to the the cecum, identified by appendiceal                         orifice and ileocecal valve. The colonoscopy was                         performed with moderate difficulty due to the                         patient's respiratory instability (hypoxia).                         Successful completion of the procedure was aided by                         managing the patient's medical instability. The                         patient tolerated the procedure fairly well. The                         quality of the bowel preparation was fair. Findings:      The perianal and digital rectal examinations  were normal. Pertinent       negatives include normal sphincter tone and no palpable rectal lesions.      A small polypoid lesion was found at the ileocecal valve. The lesion was       polypoid. No bleeding was present. Biopsies were taken with a cold       forceps for histology.      There was a large lipoma, in the transverse colon.      The retroflexed view of the distal rectum and anal verge was normal and       showed no anal or rectal abnormalities.      The exam was otherwise without abnormality. Impression:            - Preparation of the colon was fair.                        - Benign polypoid lesion at the ileocecal valve.                         Biopsied.                        - Large lipoma in the transverse colon.                        - The distal rectum and anal verge are normal on                         retroflexion view.                        - The examination was otherwise normal. Recommendation:        - Discharge patient to home (with escort).                        - Resume previous diet today.                        -  Continue present medications.                        - Await pathology results.                        - Repeat colonoscopy in 5 years for surveillance. Procedure Code(s):     --- Professional ---                        [id number], Colonoscopy, flexible; with biopsy, single or                         multiple Diagnosis Code(s):     --- Professional ---                        Z12.11, Encounter for screening for malignant neoplasm                         of colon                        D12.0, Benign neoplasm of cecum                        D17.5, Benign lipomatous neoplasm of intra-abdominal                         organs CPT copyright 2019 American Medical Association. All rights reserved. The codes documented in this report are preliminary and upon coder review may  be revised to meet current compliance requirements. Dr. Ulyess Mort Lin Landsman MD, MD 11/10/2020 11:09:43 AM This report has been signed electronically. Number of Addenda: 0 Note Initiated On: 11/10/2020 10:20 AM Scope Withdrawal Time: 0 hours 15 minutes 54 seconds  Total Procedure Duration: 0 hours 36 minutes 46 seconds  Estimated Blood Loss:  Estimated blood loss: none.      [facility]

## 2020-11-10 NOTE — Anesthesia Preprocedure Evaluation (Signed)
Anesthesia Evaluation  Patient identified by MRN, date of birth, ID band Patient awake    Reviewed: Allergy & Precautions, H&P , NPO status , Patient's Chart, lab work & pertinent test results, reviewed documented beta blocker date and time   History of Anesthesia Complications Negative for: history of anesthetic complications  Airway Mallampati: II  TM Distance: >3 FB Neck ROM: full    Dental  (+) Dental Advidsory Given, Caps, Teeth Intact, Missing   Pulmonary neg shortness of breath, sleep apnea , neg COPD, neg recent URI,    Pulmonary exam normal breath sounds clear to auscultation       Cardiovascular Exercise Tolerance: Good hypertension, (-) angina(-) Past MI and (-) Cardiac Stents Normal cardiovascular exam(-) dysrhythmias (-) Valvular Problems/Murmurs Rhythm:regular Rate:Normal     Neuro/Psych negative neurological ROS  negative psych ROS   GI/Hepatic negative GI ROS, Neg liver ROS,   Endo/Other  neg diabetesMorbid obesity  Renal/GU Renal disease (kidney stone)  negative genitourinary   Musculoskeletal   Abdominal   Peds  Hematology negative hematology ROS (+)   Anesthesia Other Findings Past Medical History: 05/30/2019: Abdominal spasms No date: Allergy No date: Hypertension No date: Kidney stone   Reproductive/Obstetrics negative OB ROS                             Anesthesia Physical Anesthesia Plan  ASA: I  Anesthesia Plan: General   Post-op Pain Management:    Induction: Intravenous  PONV Risk Score and Plan: 2 and TIVA and Propofol infusion  Airway Management Planned: Natural Airway and Nasal Cannula  Additional Equipment:   Intra-op Plan:   Post-operative Plan:   Informed Consent: I have reviewed the patients History and Physical, chart, labs and discussed the procedure including the risks, benefits and alternatives for the proposed anesthesia with the  patient or authorized representative who has indicated his/her understanding and acceptance.     Dental Advisory Given  Plan Discussed with: Anesthesiologist, CRNA and Surgeon  Anesthesia Plan Comments:         Anesthesia Quick Evaluation

## 2020-11-10 NOTE — H&P (Signed)
  Cephas Darby, MD [address]  [place]  [place], [place] [address]  Main: [phone]  Fax: [phone] Pager: [phone]  Primary Care Physician:  Pleas Koch, NP Primary Gastroenterologist:  Dr. Cephas Darby  Pre-Procedure History & Physical: HPI:  Grant Keller is a 50 y.o. male is here for an colonoscopy.   Past Medical History:  Diagnosis Date   Abdominal spasms 05/30/2019   Allergy    Hypertension    Kidney stone     History reviewed. No pertinent surgical history.  Prior to Admission medications   Medication Sig Start Date End Date Taking? Authorizing Provider  amLODipine-benazepril (LOTREL) 5-40 MG capsule Take 1 capsule by mouth daily. For blood pressure. 10/10/20  Yes Pleas Koch, NP    Allergies as of 10/20/2020 - Review Complete 10/10/2020  Allergen Reaction Noted   Naproxen Hives 04/21/2015    Family History  Problem Relation Age of Onset   Heart disease Father    Hypertension Father    Heart attack Father    Aplastic anemia Father     Social History   Socioeconomic History   Marital status: Married    Spouse name: Not on file   Number of children: Not on file   Years of education: Not on file   Highest education level: Not on file  Occupational History   Not on file  Tobacco Use   Smoking status: Never Smoker   Smokeless tobacco: Never Used   Tobacco comment: used to chew tobacco  Substance and Sexual Activity   Alcohol use: Yes    Alcohol/week: 0.0 standard drinks    Comment: social   Drug use: Not on file   Sexual activity: Not on file  Other Topics Concern   Not on file  Social History Narrative   Married.   1 son.   Works in [employer].    Enjoys coaching baseball team, playing softball.   Social Determinants of Health   Financial Resource Strain: Not on file  Food Insecurity: Not on file  Transportation Needs: Not on file  Physical Activity: Not on file  Stress: Not on file  Social  Connections: Not on file  Intimate Partner Violence: Not on file    Review of Systems: See HPI, otherwise negative ROS  Physical Exam: BP 113/85   Pulse (!) 102   Temp (!) 96.6 F (35.9 C) (Temporal)   Resp 20   Ht 6\' 1"  (1.854 m)   Wt 300 lb (136.1 kg)   SpO2 97%   BMI 39.58 kg/m  General:   Alert,  pleasant and cooperative in NAD Head:  Normocephalic and atraumatic. Neck:  Supple; no masses or thyromegaly. Lungs:  Clear throughout to auscultation.    Heart:  Regular rate and rhythm. Abdomen:  Soft, nontender and nondistended. Normal bowel sounds, without guarding, and without rebound.   Neurologic:  Alert and  oriented x4;  grossly normal neurologically.  Impression/Plan: Grant Keller is here for an colonoscopy to be performed for colon cancer screening  Risks, benefits, limitations, and alternatives regarding  colonoscopy have been reviewed with the patient.  Questions have been answered.  All parties agreeable.   Sherri Sear, MD  11/10/2020, 11:12 AM

## 2020-11-10 NOTE — Anesthesia Postprocedure Evaluation (Signed)
Anesthesia Post Note  Patient: Grant Keller  Procedure(s) Performed: COLONOSCOPY WITH PROPOFOL (N/A )  Patient location during evaluation: Endoscopy Anesthesia Type: General Level of consciousness: awake and alert Pain management: pain level controlled Vital Signs Assessment: post-procedure vital signs reviewed and stable Respiratory status: spontaneous breathing, nonlabored ventilation, respiratory function stable and patient connected to nasal cannula oxygen Cardiovascular status: blood pressure returned to baseline and stable Postop Assessment: no apparent nausea or vomiting Anesthetic complications: no   No complications documented.   Last Vitals:  Vitals:   11/10/20 1137 11/10/20 1146  BP: 116/86 126/83  Pulse:    Resp:    Temp:    SpO2:      Last Pain:  Vitals:   11/10/20 1147  TempSrc:   PainSc: 0-No pain                 Martha Clan

## 2020-11-10 NOTE — Transfer of Care (Signed)
Immediate Anesthesia Transfer of Care Note  Patient: Grant Keller  Procedure(s) Performed: COLONOSCOPY WITH PROPOFOL (N/A )  Patient Location: PACU and Endoscopy Unit  Anesthesia Type:General  Level of Consciousness: awake  Airway & Oxygen Therapy: Patient Spontanous Breathing and Patient connected to face mask oxygen  Post-op Assessment: Report given to RN  Post vital signs: stable  Last Vitals:  Vitals Value Taken Time  BP 104/59 11/10/20 1116  Temp    Pulse 100 11/10/20 1117  Resp 17 11/10/20 1117  SpO2 98 % 11/10/20 1117  Vitals shown include unvalidated device data.  Last Pain:  Vitals:   11/10/20 0922  TempSrc: Temporal  PainSc: 0-No pain         Complications: No complications documented.

## 2020-11-11 ENCOUNTER — Encounter: Payer: Self-pay | Admitting: Gastroenterology

## 2020-11-11 LAB — SURGICAL PATHOLOGY

## 2020-11-12 ENCOUNTER — Encounter: Payer: Self-pay | Admitting: Gastroenterology

## 2021-08-13 ENCOUNTER — Encounter: Payer: BC Managed Care – PPO | Admitting: Primary Care

## 2022-01-03 ENCOUNTER — Other Ambulatory Visit: Payer: Self-pay | Admitting: Primary Care

## 2022-01-03 DIAGNOSIS — I10 Essential (primary) hypertension: Secondary | ICD-10-CM

## 2022-01-15 ENCOUNTER — Encounter: Payer: Self-pay | Admitting: Nurse Practitioner

## 2022-01-15 ENCOUNTER — Ambulatory Visit: Payer: BC Managed Care – PPO | Admitting: Nurse Practitioner

## 2022-01-15 VITALS — BP 125/88 | HR 96 | Ht 73.0 in | Wt 302.0 lb

## 2022-01-15 DIAGNOSIS — I10 Essential (primary) hypertension: Secondary | ICD-10-CM | POA: Diagnosis not present

## 2022-01-15 DIAGNOSIS — E669 Obesity, unspecified: Secondary | ICD-10-CM | POA: Diagnosis not present

## 2022-01-15 DIAGNOSIS — Z7689 Persons encountering health services in other specified circumstances: Secondary | ICD-10-CM | POA: Diagnosis not present

## 2022-01-15 DIAGNOSIS — Z6839 Body mass index (BMI) 39.0-39.9, adult: Secondary | ICD-10-CM | POA: Diagnosis not present

## 2022-01-15 MED ORDER — AMLODIPINE BESY-BENAZEPRIL HCL 5-40 MG PO CAPS: 1.0000 | ORAL_CAPSULE | Freq: Every day | ORAL | 3 refills | Status: DC

## 2022-01-15 NOTE — Progress Notes (Unsigned)
New Patient Office Visit  Subjective    Patient ID: Grant Keller, male    DOB: 02-25-1971  Age: 51 y.o. MRN: [id number]  CC:  Chief Complaint  Patient presents with   New Patient (Initial Visit)    HPI Grant Keller presents to establish care. His previous PCP  was Ms. Grant Cruise, NP at [facility].  Outpatient Encounter Medications as of 01/15/2022  Medication Sig   [DISCONTINUED] amLODipine-benazepril (LOTREL) 5-40 MG capsule Take 1 capsule by mouth daily. For blood pressure.   amLODipine-benazepril (LOTREL) 5-40 MG capsule Take 1 capsule by mouth daily. For blood pressure.   No facility-administered encounter medications on file as of 01/15/2022.    Past Medical History:  Diagnosis Date   Abdominal spasms 05/30/2019   Allergy    Hypertension    Kidney stone     Past Surgical History:  Procedure Laterality Date   COLONOSCOPY WITH PROPOFOL N/A 11/10/2020   Procedure: COLONOSCOPY WITH PROPOFOL;  Surgeon: Lin Landsman, MD;  Location[facility] ENDOSCOPY;  Service: Gastroenterology;  Laterality: N/A;    Family History  Problem Relation Age of Onset   Heart disease Father    Hypertension Father    Heart attack Father    Aplastic anemia Father     Social History   Socioeconomic History   Marital status: Married    Spouse name: Not on file   Number of children: Not on file   Years of education: Not on file   Highest education level: Not on file  Occupational History   Not on file  Tobacco Use   Smoking status: Never   Smokeless tobacco: Never   Tobacco comments:    used to chew tobacco  Vaping Use   Vaping Use: Never used  Substance and Sexual Activity   Alcohol use: Yes    Alcohol/week: 0.0 standard drinks    Comment: social   Drug use: Yes    Types: Marijuana   Sexual activity: Yes  Other Topics Concern   Not on file  Social History Narrative   Married.   1 son.   Works in [employer].    Enjoys coaching baseball team, playing softball.    Social Determinants of Health   Financial Resource Strain: Not on file  Food Insecurity: Not on file  Transportation Needs: Not on file  Physical Activity: Not on file  Stress: Not on file  Social Connections: Not on file  Intimate Partner Violence: Not on file    Review of Systems  Constitutional:  Negative for chills and fever.  HENT:  Negative for congestion, ear pain and hearing loss.   Eyes:  Negative for blurred vision and photophobia.  Respiratory:  Negative for cough, hemoptysis and shortness of breath.   Cardiovascular:  Negative for chest pain, orthopnea and leg swelling.  Gastrointestinal:  Negative for heartburn and vomiting.  Genitourinary:  Negative for dysuria and frequency.  Musculoskeletal:  Negative for back pain and myalgias.  Skin:  Negative for itching and rash.  Neurological:  Negative for dizziness, tremors and headaches.  Psychiatric/Behavioral:  Negative for depression and suicidal ideas.        Objective    BP 125/88   Pulse 96   Ht '6\' 1"'$  (1.854 m)   Wt (!) 302 lb (137 kg)   BMI 39.84 kg/m   Physical Exam Constitutional:      Appearance: Normal appearance. He is obese.  HENT:     Head: Normocephalic and atraumatic.  Right Ear: Tympanic membrane normal.     Left Ear: Tympanic membrane normal.     Nose: Nose normal.     Mouth/Throat:     Mouth: Mucous membranes are moist.     Pharynx: Oropharynx is clear.  Eyes:     Extraocular Movements: Extraocular movements intact.     Conjunctiva/sclera: Conjunctivae normal.     Pupils: Pupils are equal, round, and reactive to light.  Cardiovascular:     Rate and Rhythm: Normal rate and regular rhythm.     Pulses: Normal pulses.     Heart sounds: Normal heart sounds.  Pulmonary:     Effort: Pulmonary effort is normal.     Breath sounds: Normal breath sounds.  Abdominal:     General: Bowel sounds are normal.     Palpations: Abdomen is soft.  Musculoskeletal:        General: Normal range  of motion.     Cervical back: Normal range of motion.  Skin:    General: Skin is warm.     Capillary Refill: Capillary refill takes less than 2 seconds.  Neurological:     General: No focal deficit present.     Mental Status: He is alert and oriented to person, place, and time. Mental status is at baseline.  Psychiatric:        Mood and Affect: Mood normal.        Behavior: Behavior normal.        Thought Content: Thought content normal.        Judgment: Judgment normal.        Assessment & Plan:   Problem List Items Addressed This Visit       Cardiovascular and Mediastinum   Essential hypertension    Patient blood pressure 125/88 in the office today. Continue amlodipine benazepril 5-40 mg daily. Advised patient to limit the intake of sodium. We will continue to monitor.        Relevant Medications   amLODipine-benazepril (LOTREL) 5-40 MG capsule     Other   Establishing care with new doctor, encounter for - Primary    Care established. Screening labs ordered. Advised patient to eat heart healthy  diet and follow continues physical activity.        Relevant Orders   CBC with Differential/Platelet (Completed)   COMPLETE METABOLIC PANEL WITH GFR (Completed)   Lipid panel (Completed)   TSH (Completed)   PSA (Completed)   Hemoglobin A1c (Completed)   Class 2 obesity without serious comorbidity with body mass index (BMI) of 39.0 to 39.9 in adult    BMI 39.84 in the office today. Advised patient to limit the intake of fatty and fried food. Advised patient to decrease the caloric intake and increase physical activity        No follow-ups on file.   Grant Lo, NP

## 2022-01-16 LAB — COMPLETE METABOLIC PANEL WITH GFR
AG Ratio: 1.8 (calc) (ref 1.0–2.5)
ALT: 26 U/L (ref 9–46)
AST: 21 U/L (ref 10–40)
Albumin: 4.9 g/dL (ref 3.6–5.1)
Alkaline phosphatase (APISO): 73 U/L (ref 36–130)
BUN: 18 mg/dL (ref 7–25)
CO2: 25 mmol/L (ref 20–32)
Calcium: 9.7 mg/dL (ref 8.6–10.3)
Chloride: 104 mmol/L (ref 98–110)
Creat: 1.02 mg/dL (ref 0.60–1.29)
Globulin: 2.7 g/dL (calc) (ref 1.9–3.7)
Glucose, Bld: 90 mg/dL (ref 65–99)
Potassium: 4.6 mmol/L (ref 3.5–5.3)
Sodium: 140 mmol/L (ref 135–146)
Total Bilirubin: 0.9 mg/dL (ref 0.2–1.2)
Total Protein: 7.6 g/dL (ref 6.1–8.1)
eGFR: 90 mL/min/{1.73_m2} (ref >=60)

## 2022-01-16 LAB — CBC WITH DIFFERENTIAL/PLATELET
Absolute Monocytes: 701 cells/uL (ref 200–950)
Basophils Absolute: 57 cells/uL (ref 0–200)
Basophils Relative: 0.5 %
Eosinophils Absolute: 305 cells/uL (ref 15–500)
Eosinophils Relative: 2.7 %
HCT: 45.2 % (ref 38.5–50.0)
Hemoglobin: 15.4 g/dL (ref 13.2–17.1)
Lymphs Abs: 3006 cells/uL (ref 850–3900)
MCH: 30.2 pg (ref 27.0–33.0)
MCHC: 34.1 g/dL (ref 32.0–36.0)
MCV: 88.6 fL (ref 80.0–100.0)
MPV: 10.5 fL (ref 7.5–12.5)
Monocytes Relative: 6.2 %
Neutro Abs: 7232 cells/uL (ref 1500–7800)
Neutrophils Relative %: 64 %
Platelets: 235 10*3/uL (ref 140–400)
RBC: 5.1 10*6/uL (ref 4.20–5.80)
RDW: 12.1 % (ref 11.0–15.0)
Total Lymphocyte: 26.6 %
WBC: 11.3 10*3/uL — ABNORMAL HIGH (ref 3.8–10.8)

## 2022-01-16 LAB — LIPID PANEL
Cholesterol: 192 mg/dL (ref <200)
HDL: 29 mg/dL — ABNORMAL LOW (ref >=40)
LDL Cholesterol (Calc): 133 mg/dL (calc) — ABNORMAL HIGH
Non-HDL Cholesterol (Calc): 163 mg/dL (calc) — ABNORMAL HIGH (ref <130)
Total CHOL/HDL Ratio: 6.6 (calc) — ABNORMAL HIGH (ref <5.0)
Triglycerides: 166 mg/dL — ABNORMAL HIGH (ref <150)

## 2022-01-16 LAB — HEMOGLOBIN A1C
Hgb A1c MFr Bld: 5.3 % of total Hgb (ref <5.7)
Mean Plasma Glucose: 105 mg/dL
eAG (mmol/L): 5.8 mmol/L

## 2022-01-16 LAB — PSA: PSA: 0.44 ng/mL (ref <=4.00)

## 2022-01-16 LAB — TSH: TSH: 1.67 mIU/L (ref 0.40–4.50)

## 2022-01-18 DIAGNOSIS — Z7689 Persons encountering health services in other specified circumstances: Secondary | ICD-10-CM | POA: Insufficient documentation

## 2022-01-18 DIAGNOSIS — E669 Obesity, unspecified: Secondary | ICD-10-CM | POA: Insufficient documentation

## 2022-01-18 NOTE — Assessment & Plan Note (Signed)
Care established. Screening labs ordered. Advised patient to eat heart healthy  diet and follow continues physical activity.

## 2022-01-18 NOTE — Assessment & Plan Note (Signed)
Patient blood pressure 125/88 in the office today. Continue amlodipine benazepril 5-40 mg daily. Advised patient to limit the intake of sodium. We will continue to monitor.

## 2022-01-18 NOTE — Assessment & Plan Note (Signed)
BMI 39.84 in the office today. Advised patient to limit the intake of fatty and fried food. Advised patient to decrease the caloric intake and increase physical activity

## 2022-01-22 ENCOUNTER — Encounter: Payer: Self-pay | Admitting: Nurse Practitioner

## 2022-01-22 ENCOUNTER — Ambulatory Visit: Payer: BC Managed Care – PPO | Admitting: Nurse Practitioner

## 2022-01-22 VITALS — BP 123/85 | HR 88 | Ht 73.0 in | Wt 300.2 lb

## 2022-01-22 DIAGNOSIS — E785 Hyperlipidemia, unspecified: Secondary | ICD-10-CM | POA: Diagnosis not present

## 2022-01-22 DIAGNOSIS — E669 Obesity, unspecified: Secondary | ICD-10-CM

## 2022-01-22 DIAGNOSIS — Z6839 Body mass index (BMI) 39.0-39.9, adult: Secondary | ICD-10-CM | POA: Diagnosis not present

## 2022-01-22 DIAGNOSIS — E66812 Obesity, class 2: Secondary | ICD-10-CM

## 2022-01-22 NOTE — Progress Notes (Signed)
New Patient Office Visit  Subjective    Patient ID: Grant Keller, male    DOB: 08/01/1971  Age: 51 y.o. MRN: [id number]  CC:  Chief Complaint  Patient presents with   lab results    HPI Grant Keller presents for lab review. He is overall doing well. No acute complaints at present.   Outpatient Encounter Medications as of 01/22/2022  Medication Sig   amLODipine-benazepril (LOTREL) 5-40 MG capsule Take 1 capsule by mouth daily. For blood pressure.   No facility-administered encounter medications on file as of 01/22/2022.    Past Medical History:  Diagnosis Date   Abdominal spasms 05/30/2019   Allergy    Hypertension    Kidney stone     Past Surgical History:  Procedure Laterality Date   COLONOSCOPY WITH PROPOFOL N/A 11/10/2020   Procedure: COLONOSCOPY WITH PROPOFOL;  Surgeon: Lin Landsman, MD;  Location: [facility] ENDOSCOPY;  Service: Gastroenterology;  Laterality: N/A;    Family History  Problem Relation Age of Onset   Heart disease Father    Hypertension Father    Heart attack Father    Aplastic anemia Father     Social History   Socioeconomic History   Marital status: Married    Spouse name: Not on file   Number of children: Not on file   Years of education: Not on file   Highest education level: Not on file  Occupational History   Not on file  Tobacco Use   Smoking status: Never   Smokeless tobacco: Never   Tobacco comments:    used to chew tobacco  Vaping Use   Vaping Use: Never used  Substance and Sexual Activity   Alcohol use: Yes    Alcohol/week: 0.0 standard drinks    Comment: social   Drug use: Yes    Types: Marijuana   Sexual activity: Yes  Other Topics Concern   Not on file  Social History Narrative   Married.   1 son.   Works in [employer].    Enjoys coaching baseball team, playing softball.   Social Determinants of Health   Financial Resource Strain: Not on file  Food Insecurity: Not on file  Transportation Needs: Not on  file  Physical Activity: Not on file  Stress: Not on file  Social Connections: Not on file  Intimate Partner Violence: Not on file    Review of Systems  Constitutional:  Negative for chills and fever.  HENT:  Negative for congestion, ear pain and hearing loss.   Eyes:  Negative for blurred vision and photophobia.  Respiratory:  Negative for cough, hemoptysis and shortness of breath.   Cardiovascular:  Negative for chest pain, orthopnea and leg swelling.  Gastrointestinal:  Negative for heartburn and vomiting.  Genitourinary:  Negative for dysuria and frequency.  Musculoskeletal:  Negative for back pain and myalgias.  Skin:  Negative for itching and rash.  Neurological:  Negative for dizziness, tremors and headaches.  Psychiatric/Behavioral:  Negative for depression and suicidal ideas.        Objective    BP 123/85   Pulse 88   Ht '6\' 1"'$  (1.854 m)   Wt (!) 300 lb 3.2 oz (136.2 kg)   BMI 39.61 kg/m   Physical Exam Constitutional:      Appearance: Normal appearance. He is obese.  HENT:     Head: Normocephalic and atraumatic.     Right Ear: Tympanic membrane normal.     Left Ear: Tympanic membrane normal.  Nose: Nose normal.     Mouth/Throat:     Mouth: Mucous membranes are moist.     Pharynx: Oropharynx is clear.  Eyes:     Extraocular Movements: Extraocular movements intact.     Conjunctiva/sclera: Conjunctivae normal.     Pupils: Pupils are equal, round, and reactive to light.  Cardiovascular:     Rate and Rhythm: Normal rate and regular rhythm.     Pulses: Normal pulses.     Heart sounds: Normal heart sounds.  Pulmonary:     Effort: Pulmonary effort is normal.     Breath sounds: Normal breath sounds.  Abdominal:     General: Bowel sounds are normal.     Palpations: Abdomen is soft.  Musculoskeletal:        General: Normal range of motion.     Cervical back: Normal range of motion.  Skin:    General: Skin is warm.     Capillary Refill: Capillary refill  takes less than 2 seconds.  Neurological:     General: No focal deficit present.     Mental Status: He is alert and oriented to person, place, and time. Mental status is at baseline.  Psychiatric:        Mood and Affect: Mood normal.        Behavior: Behavior normal.        Thought Content: Thought content normal.        Judgment: Judgment normal.        Assessment & Plan:   Problem List Items Addressed This Visit       Other   Hyperlipidemia - Primary    Patient has elevated LDL level 133, triglycerides 166 and lower level of HDL 29.  Lowering the levels of LDL helps reduce the risk of cardiovascular diseases.  LDL can be lowered via lifestyle modification and pharmacological therapy.  Eat healthy diet, perform regular aerobic exercise and weight loss. Patient would like to try lifestyle modification at present.       Class 2 obesity without serious comorbidity with body mass index (BMI) of 39.0 to 39.9 in adult    BMI 39.61 in the office today Advised pt to lose weight. Advised patient to avoid trans fat, fatty and fried food. Follow a regular physical activity schedule. Went over the risk of chronic diseases with increased weight.            Return in about 3 months (around 04/24/2022).   Grant Lo, NP

## 2022-01-24 NOTE — Assessment & Plan Note (Signed)
BMI 39.61 in the office today Advised pt to lose weight. Advised patient to avoid trans fat, fatty and fried food. Follow a regular physical activity schedule. Went over the risk of chronic diseases with increased weight.

## 2022-01-24 NOTE — Assessment & Plan Note (Signed)
Patient has elevated LDL level 133, triglycerides 166 and lower level of HDL 29.  Lowering the levels of LDL helps reduce the risk of cardiovascular diseases.  LDL can be lowered via lifestyle modification and pharmacological therapy.  Eat healthy diet, perform regular aerobic exercise and weight loss. Patient would like to try lifestyle modification at present.

## 2022-04-23 ENCOUNTER — Ambulatory Visit: Payer: BC Managed Care – PPO | Admitting: Nurse Practitioner

## 2022-05-06 ENCOUNTER — Ambulatory Visit: Payer: BC Managed Care – PPO | Admitting: Nurse Practitioner

## 2022-05-06 ENCOUNTER — Encounter: Payer: Self-pay | Admitting: Nurse Practitioner

## 2022-05-06 VITALS — BP 132/84 | Ht 73.5 in | Wt 298.5 lb

## 2022-05-06 DIAGNOSIS — J302 Other seasonal allergic rhinitis: Secondary | ICD-10-CM | POA: Diagnosis not present

## 2022-05-06 DIAGNOSIS — E785 Hyperlipidemia, unspecified: Secondary | ICD-10-CM

## 2022-05-06 DIAGNOSIS — I10 Essential (primary) hypertension: Secondary | ICD-10-CM

## 2022-05-06 MED ORDER — AMLODIPINE BESY-BENAZEPRIL HCL 5-40 MG PO CAPS: 1.0000 | ORAL_CAPSULE | Freq: Every day | ORAL | 3 refills | Status: AC

## 2022-05-06 MED ORDER — FEXOFENADINE HCL 60 MG PO TABS: 60.0000 mg | ORAL_TABLET | Freq: Two times a day (BID) | ORAL | 1 refills | Status: DC

## 2022-05-06 NOTE — Assessment & Plan Note (Signed)
Blood pressure 132/48 in the office today. Continue amlodipine-benazepril daily. Advised patient to eat low-salt heart healthy diet.

## 2022-05-06 NOTE — Assessment & Plan Note (Signed)
Started him on Allegra 60 mg twice daily. We will continue to monitor

## 2022-05-06 NOTE — Assessment & Plan Note (Signed)
Body mass index is 38.85 kg/m. Advised pt to lose weight. Advised patient to avoid trans fat, fatty and fried food. Follow a regular physical activity schedule. Marland Kitchen

## 2022-05-06 NOTE — Progress Notes (Signed)
Established Patient Office Visit  Subjective:  Patient ID: Grant Keller, male    DOB: 05/05/1971  Age: 51 y.o. MRN: [id number]  CC:  Chief Complaint  Patient presents with   Follow-up     HPI  AUTHOR HATLESTAD presents for routine follow-up.  No new complaints at present.  Patient is overall doing fine.  HPI   Past Medical History:  Diagnosis Date   Abdominal spasms 05/30/2019   Allergy    Hypertension    Kidney stone     Past Surgical History:  Procedure Laterality Date   COLONOSCOPY WITH PROPOFOL N/A 11/10/2020   Procedure: COLONOSCOPY WITH PROPOFOL;  Surgeon: Lin Landsman, MD;  Location: [facility] ENDOSCOPY;  Service: Gastroenterology;  Laterality: N/A;    Family History  Problem Relation Age of Onset   Heart disease Father    Hypertension Father    Heart attack Father    Aplastic anemia Father     Social History   Socioeconomic History   Marital status: Married    Spouse name: Not on file   Number of children: Not on file   Years of education: Not on file   Highest education level: Not on file  Occupational History   Not on file  Tobacco Use   Smoking status: Never   Smokeless tobacco: Never   Tobacco comments:    used to chew tobacco  Vaping Use   Vaping Use: Never used  Substance and Sexual Activity   Alcohol use: Yes    Alcohol/week: 0.0 standard drinks of alcohol    Comment: social   Drug use: Yes    Types: Marijuana   Sexual activity: Yes  Other Topics Concern   Not on file  Social History Narrative   Married.   1 son.   Works in [employer].    Enjoys coaching baseball team, playing softball.   Social Determinants of Health   Financial Resource Strain: Not on file  Food Insecurity: Not on file  Transportation Needs: Not on file  Physical Activity: Not on file  Stress: Not on file  Social Connections: Not on file  Intimate Partner Violence: Not on file     Outpatient Medications Prior to Visit  Medication Sig Dispense  Refill   amLODipine-benazepril (LOTREL) 5-40 MG capsule Take 1 capsule by mouth daily. For blood pressure. 90 capsule 3   No facility-administered medications prior to visit.    Allergies  Allergen Reactions   Naproxen Hives    ROS Review of Systems  Constitutional: Negative.   HENT: Negative.    Eyes: Negative.   Respiratory:  Negative for chest tightness and shortness of breath.   Cardiovascular:  Negative for chest pain and palpitations.  Gastrointestinal: Negative.   Genitourinary: Negative.   Musculoskeletal: Negative.   Neurological: Negative.   Hematological: Negative.   Psychiatric/Behavioral: Negative.        Objective:    Physical Exam Constitutional:      Appearance: Normal appearance. He is obese.  HENT:     Head: Normocephalic.     Right Ear: Tympanic membrane normal.     Left Ear: Tympanic membrane normal.     Nose: Nose normal.  Eyes:     Conjunctiva/sclera: Conjunctivae normal.     Pupils: Pupils are equal, round, and reactive to light.  Cardiovascular:     Rate and Rhythm: Normal rate and regular rhythm.     Pulses: Normal pulses.     Heart sounds: Normal heart sounds.  Pulmonary:     Effort: Pulmonary effort is normal.     Breath sounds: Normal breath sounds.  Abdominal:     General: Bowel sounds are normal.     Palpations: Abdomen is soft. There is no mass.  Musculoskeletal:        General: Normal range of motion.     Cervical back: Normal range of motion.  Skin:    General: Skin is warm.  Neurological:     General: No focal deficit present.     Mental Status: He is alert and oriented to person, place, and time. Mental status is at baseline.  Psychiatric:        Mood and Affect: Mood normal.        Behavior: Behavior normal.        Thought Content: Thought content normal.        Judgment: Judgment normal.     BP 132/84   Ht 6' 1.5" (1.867 m)   Wt 298 lb 8 oz (135.4 kg)   BMI 38.85 kg/m  Wt Readings from Last 3 Encounters:   05/06/22 298 lb 8 oz (135.4 kg)  01/22/22 (!) 300 lb 3.2 oz (136.2 kg)  01/15/22 (!) 302 lb (137 kg)     Health Maintenance  Topic Date Due   COVID-19 Vaccine (3 - Moderna series) 01/25/2020   Zoster Vaccines- Shingrix (1 of 2) Never done   COLONOSCOPY (Pts 45-69yr Insurance coverage will need to be confirmed)  11/10/2025   TETANUS/TDAP  10/10/2030   Hepatitis C Screening  Completed   HIV Screening  Completed   HPV VACCINES  Aged Out   INFLUENZA VACCINE  Discontinued    There are no preventive care reminders to display for this patient.  Lab Results  Component Value Date   TSH 1.67 01/15/2022   Lab Results  Component Value Date   WBC 11.3 (H) 01/15/2022   HGB 15.4 01/15/2022   HCT 45.2 01/15/2022   MCV 88.6 01/15/2022   PLT 235 01/15/2022   Lab Results  Component Value Date   NA 140 01/15/2022   K 4.6 01/15/2022   CO2 25 01/15/2022   GLUCOSE 90 01/15/2022   BUN 18 01/15/2022   CREATININE 1.02 01/15/2022   BILITOT 0.9 01/15/2022   ALKPHOS 86 10/10/2020   AST 21 01/15/2022   ALT 26 01/15/2022   PROT 7.6 01/15/2022   ALBUMIN 4.6 10/10/2020   CALCIUM 9.7 01/15/2022   ANIONGAP 8 12/26/2016   EGFR 90 01/15/2022   GFR 87.90 10/10/2020   Lab Results  Component Value Date   CHOL 192 01/15/2022   Lab Results  Component Value Date   HDL 29 (L) 01/15/2022   Lab Results  Component Value Date   LDLCALC 133 (H) 01/15/2022   Lab Results  Component Value Date   TRIG 166 (H) 01/15/2022   Lab Results  Component Value Date   CHOLHDL 6.6 (H) 01/15/2022   Lab Results  Component Value Date   HGBA1C 5.3 01/15/2022      Assessment & Plan:   Problem List Items Addressed This Visit       Cardiovascular and Mediastinum   Essential hypertension - Primary    Blood pressure 132/48 in the office today. Continue amlodipine-benazepril daily. Advised patient to eat low-salt heart healthy diet.       Relevant Medications   amLODipine-benazepril (LOTREL) 5-40  MG capsule     Other   Hyperlipidemia    Advised patient to avoid fatty  and fried food. Will check lipids at next visit.       Relevant Medications   amLODipine-benazepril (LOTREL) 5-40 MG capsule   Class 2 severe obesity due to excess calories with serious comorbidity in adult [facility])    Body mass index is 38.85 kg/m. Advised pt to lose weight. Advised patient to avoid trans fat, fatty and fried food. Follow a regular physical activity schedule. .           Seasonal allergies    Started him on Allegra 60 mg twice daily. We will continue to monitor        Meds ordered this encounter  Medications   amLODipine-benazepril (LOTREL) 5-40 MG capsule    Sig: Take 1 capsule by mouth daily. For blood pressure.    Dispense:  90 capsule    Refill:  3   fexofenadine (ALLEGRA ALLERGY) 60 MG tablet    Sig: Take 1 tablet (60 mg total) by mouth 2 (two) times daily.    Dispense:  60 tablet    Refill:  1     Follow-up: No follow-ups on file.    Theresia Lo, NP

## 2022-05-06 NOTE — Assessment & Plan Note (Signed)
Advised patient to avoid fatty and fried food. Will check lipids at next visit.

## 2022-08-05 ENCOUNTER — Ambulatory Visit (INDEPENDENT_AMBULATORY_CARE_PROVIDER_SITE_OTHER): Payer: BC Managed Care – PPO | Admitting: Nurse Practitioner

## 2022-08-05 ENCOUNTER — Encounter: Payer: Self-pay | Admitting: Nurse Practitioner

## 2022-08-05 VITALS — BP 128/82 | HR 102 | Ht 73.5 in | Wt 304.9 lb

## 2022-08-05 DIAGNOSIS — I10 Essential (primary) hypertension: Secondary | ICD-10-CM | POA: Diagnosis not present

## 2022-08-05 DIAGNOSIS — Z6839 Body mass index (BMI) 39.0-39.9, adult: Secondary | ICD-10-CM | POA: Diagnosis not present

## 2022-08-05 DIAGNOSIS — E66812 Obesity, class 2: Secondary | ICD-10-CM

## 2022-08-05 DIAGNOSIS — G4733 Obstructive sleep apnea (adult) (pediatric): Secondary | ICD-10-CM

## 2022-08-05 NOTE — Progress Notes (Incomplete)
Established Patient Office Visit  Subjective:  Patient ID: Grant Keller, male    DOB: 03-May-1971  Age: 51 y.o. MRN: [id number]  CC:  Chief Complaint  Patient presents with  . Hypertension  . Hyperlipidemia     HPI  Grant Keller presents for follow-up on hypertension and hyperlipidemia.  Patient states that he gained 6 pounds since his last visit.  Hypertension Pertinent negatives include no chest pain, headaches, palpitations or shortness of breath.  Hyperlipidemia Pertinent negatives include no chest pain or shortness of breath.     Past Medical History:  Diagnosis Date  . Abdominal spasms 05/30/2019  . Allergy   . Hypertension   . Kidney stone     Past Surgical History:  Procedure Laterality Date  . COLONOSCOPY WITH PROPOFOL N/A 11/10/2020   Procedure: COLONOSCOPY WITH PROPOFOL;  Surgeon: Lin Landsman, MD;  Location: [facility] ENDOSCOPY;  Service: Gastroenterology;  Laterality: N/A;    Family History  Problem Relation Age of Onset  . Heart disease Father   . Hypertension Father   . Heart attack Father   . Aplastic anemia Father     Social History   Socioeconomic History  . Marital status: Married    Spouse name: Not on file  . Number of children: Not on file  . Years of education: Not on file  . Highest education level: Not on file  Occupational History  . Not on file  Tobacco Use  . Smoking status: Never  . Smokeless tobacco: Never  . Tobacco comments:    used to chew tobacco  Vaping Use  . Vaping Use: Never used  Substance and Sexual Activity  . Alcohol use: Yes    Alcohol/week: 0.0 standard drinks of alcohol    Comment: social  . Drug use: Yes    Types: Marijuana  . Sexual activity: Yes  Other Topics Concern  . Not on file  Social History Narrative   Married.   1 son.   Works in [employer].    Enjoys coaching baseball team, playing softball.   Social Determinants of Health   Financial Resource Strain: Not on file  Food  Insecurity: Not on file  Transportation Needs: Not on file  Physical Activity: Not on file  Stress: Not on file  Social Connections: Not on file  Intimate Partner Violence: Not on file     Outpatient Medications Prior to Visit  Medication Sig Dispense Refill  . amLODipine-benazepril (LOTREL) 5-40 MG capsule Take 1 capsule by mouth daily. For blood pressure. 90 capsule 3  . fexofenadine (ALLEGRA ALLERGY) 60 MG tablet Take 1 tablet (60 mg total) by mouth 2 (two) times daily. 60 tablet 1   No facility-administered medications prior to visit.    Allergies  Allergen Reactions  . Naproxen Hives    ROS Review of Systems  Constitutional: Negative.   HENT: Negative.    Eyes: Negative.   Respiratory:  Negative for chest tightness and shortness of breath.   Cardiovascular:  Negative for chest pain and palpitations.  Gastrointestinal: Negative.   Endocrine: Negative.   Genitourinary: Negative.   Musculoskeletal: Negative.   Neurological:  Negative for dizziness, facial asymmetry and headaches.  Psychiatric/Behavioral:  Negative for agitation, behavioral problems and confusion.       Objective:    Physical Exam Constitutional:      Appearance: Normal appearance. He is obese.  HENT:     Right Ear: Tympanic membrane normal.     Left Ear: Tympanic  membrane normal.     Mouth/Throat:     Mouth: Mucous membranes are moist.     Pharynx: Oropharynx is clear.  Eyes:     Extraocular Movements: Extraocular movements intact.     Pupils: Pupils are equal, round, and reactive to light.  Cardiovascular:     Rate and Rhythm: Normal rate and regular rhythm.     Pulses: Normal pulses.     Heart sounds: Normal heart sounds.  Musculoskeletal:     Cervical back: Normal range of motion.  Neurological:     Mental Status: He is alert.     BP 128/82   Pulse (!) 102   Ht 6' 1.5" (1.867 m)   Wt (!) 304 lb 14.2 oz (138.3 kg)   SpO2 94%   BMI 39.68 kg/m  Wt Readings from Last 3  Encounters:  08/05/22 (!) 304 lb 14.2 oz (138.3 kg)  05/06/22 298 lb 8 oz (135.4 kg)  01/22/22 (!) 300 lb 3.2 oz (136.2 kg)     Health Maintenance  Topic Date Due  . COVID-19 Vaccine (3 - 2023-24 season) 08/21/2022 (Originally 04/23/2022)  . Zoster Vaccines- Shingrix (1 of 2) 11/04/2022 (Originally 03/12/2022)  . COLONOSCOPY (Pts 45-68yr Insurance coverage will need to be confirmed)  11/10/2025  . DTaP/Tdap/Td (2 - Td or Tdap) 10/10/2030  . Hepatitis C Screening  Completed  . HIV Screening  Completed  . HPV VACCINES  Aged Out  . INFLUENZA VACCINE  Discontinued    There are no preventive care reminders to display for this patient.  Lab Results  Component Value Date   TSH 1.67 01/15/2022   Lab Results  Component Value Date   WBC 11.3 (H) 01/15/2022   HGB 15.4 01/15/2022   HCT 45.2 01/15/2022   MCV 88.6 01/15/2022   PLT 235 01/15/2022   Lab Results  Component Value Date   NA 140 01/15/2022   K 4.6 01/15/2022   CO2 25 01/15/2022   GLUCOSE 90 01/15/2022   BUN 18 01/15/2022   CREATININE 1.02 01/15/2022   BILITOT 0.9 01/15/2022   ALKPHOS 86 10/10/2020   AST 21 01/15/2022   ALT 26 01/15/2022   PROT 7.6 01/15/2022   ALBUMIN 4.6 10/10/2020   CALCIUM 9.7 01/15/2022   ANIONGAP 8 12/26/2016   EGFR 90 01/15/2022   GFR 87.90 10/10/2020   Lab Results  Component Value Date   CHOL 192 01/15/2022   Lab Results  Component Value Date   HDL 29 (L) 01/15/2022   Lab Results  Component Value Date   LDLCALC 133 (H) 01/15/2022   Lab Results  Component Value Date   TRIG 166 (H) 01/15/2022   Lab Results  Component Value Date   CHOLHDL 6.6 (H) 01/15/2022   Lab Results  Component Value Date   HGBA1C 5.3 01/15/2022      Assessment & Plan:   Problem List Items Addressed This Visit       Cardiovascular and Mediastinum   Essential hypertension - Primary    Patient BP  Vitals:   08/05/22 1550  BP: 128/82    in the office 08/05/22  Advised pt to follow a low sodium  and heart healthy diet. Continue amlodipine-benazepril daily         Respiratory   OSA (obstructive sleep apnea)     Other   Hyperlipidemia     No orders of the defined types were placed in this encounter.    Follow-up: No follow-ups on file.  Theresia Lo, NP

## 2022-08-05 NOTE — Progress Notes (Signed)
Established Patient Office Visit  Subjective:  Patient ID: Grant Keller, male    DOB: 29-Jan-1971  Age: 51 y.o. MRN: [id number]  CC:  Chief Complaint  Patient presents with   Hypertension   Hyperlipidemia     HPI  Grant Keller presents for follow-up on hypertension and hyperlipidemia.  Patient states that he gained 6 pounds since his last visit.  Hypertension Pertinent negatives include no chest pain, headaches, palpitations or shortness of breath.  Hyperlipidemia Pertinent negatives include no chest pain or shortness of breath.     Past Medical History:  Diagnosis Date   Abdominal spasms 05/30/2019   Allergy    Hypertension    Kidney stone     Past Surgical History:  Procedure Laterality Date   COLONOSCOPY WITH PROPOFOL N/A 11/10/2020   Procedure: COLONOSCOPY WITH PROPOFOL;  Surgeon: Lin Landsman, MD;  Location: [facility] ENDOSCOPY;  Service: Gastroenterology;  Laterality: N/A;    Family History  Problem Relation Age of Onset   Heart disease Father    Hypertension Father    Heart attack Father    Aplastic anemia Father     Social History   Socioeconomic History   Marital status: Married    Spouse name: Not on file   Number of children: Not on file   Years of education: Not on file   Highest education level: Not on file  Occupational History   Not on file  Tobacco Use   Smoking status: Never   Smokeless tobacco: Never   Tobacco comments:    used to chew tobacco  Vaping Use   Vaping Use: Never used  Substance and Sexual Activity   Alcohol use: Yes    Alcohol/week: 0.0 standard drinks of alcohol    Comment: social   Drug use: Yes    Types: Marijuana   Sexual activity: Yes  Other Topics Concern   Not on file  Social History Narrative   Married.   1 son.   Works in [employer].    Enjoys coaching baseball team, playing softball.   Social Determinants of Health   Financial Resource Strain: Not on file  Food Insecurity: Not on file   Transportation Needs: Not on file  Physical Activity: Not on file  Stress: Not on file  Social Connections: Not on file  Intimate Partner Violence: Not on file     Outpatient Medications Prior to Visit  Medication Sig Dispense Refill   amLODipine-benazepril (LOTREL) 5-40 MG capsule Take 1 capsule by mouth daily. For blood pressure. 90 capsule 3   fexofenadine (ALLEGRA ALLERGY) 60 MG tablet Take 1 tablet (60 mg total) by mouth 2 (two) times daily. 60 tablet 1   No facility-administered medications prior to visit.    Allergies  Allergen Reactions   Naproxen Hives    ROS Review of Systems  Constitutional: Negative.   HENT: Negative.    Eyes: Negative.   Respiratory:  Negative for chest tightness and shortness of breath.   Cardiovascular:  Negative for chest pain and palpitations.  Gastrointestinal: Negative.   Endocrine: Negative.   Genitourinary: Negative.   Musculoskeletal: Negative.   Neurological:  Negative for dizziness, facial asymmetry and headaches.  Psychiatric/Behavioral:  Negative for agitation, behavioral problems and confusion.       Objective:    Physical Exam Constitutional:      Appearance: Normal appearance. He is obese.  HENT:     Right Ear: Tympanic membrane normal.     Left Ear: Tympanic  membrane normal.     Mouth/Throat:     Mouth: Mucous membranes are moist.     Pharynx: Oropharynx is clear.  Eyes:     Extraocular Movements: Extraocular movements intact.     Pupils: Pupils are equal, round, and reactive to light.  Cardiovascular:     Rate and Rhythm: Normal rate and regular rhythm.     Pulses: Normal pulses.     Heart sounds: Normal heart sounds.  Musculoskeletal:     Cervical back: Normal range of motion.  Neurological:     Mental Status: He is alert.     BP 128/82   Pulse (!) 102   Ht 6' 1.5" (1.867 m)   Wt (!) 304 lb 14.2 oz (138.3 kg)   SpO2 94%   BMI 39.68 kg/m  Wt Readings from Last 3 Encounters:  08/05/22 (!) 304 lb  14.2 oz (138.3 kg)  05/06/22 298 lb 8 oz (135.4 kg)  01/22/22 (!) 300 lb 3.2 oz (136.2 kg)     Health Maintenance  Topic Date Due   COVID-19 Vaccine (3 - 2023-24 season) 08/21/2022 (Originally 04/23/2022)   Zoster Vaccines- Shingrix (1 of 2) 11/04/2022 (Originally 03/12/2022)   COLONOSCOPY (Pts 45-80yr Insurance coverage will need to be confirmed)  11/10/2025   DTaP/Tdap/Td (2 - Td or Tdap) 10/10/2030   Hepatitis C Screening  Completed   HIV Screening  Completed   HPV VACCINES  Aged Out   INFLUENZA VACCINE  Discontinued    There are no preventive care reminders to display for this patient.  Lab Results  Component Value Date   TSH 1.67 01/15/2022   Lab Results  Component Value Date   WBC 11.3 (H) 01/15/2022   HGB 15.4 01/15/2022   HCT 45.2 01/15/2022   MCV 88.6 01/15/2022   PLT 235 01/15/2022   Lab Results  Component Value Date   NA 140 01/15/2022   K 4.6 01/15/2022   CO2 25 01/15/2022   GLUCOSE 90 01/15/2022   BUN 18 01/15/2022   CREATININE 1.02 01/15/2022   BILITOT 0.9 01/15/2022   ALKPHOS 86 10/10/2020   AST 21 01/15/2022   ALT 26 01/15/2022   PROT 7.6 01/15/2022   ALBUMIN 4.6 10/10/2020   CALCIUM 9.7 01/15/2022   ANIONGAP 8 12/26/2016   EGFR 90 01/15/2022   GFR 87.90 10/10/2020   Lab Results  Component Value Date   CHOL 192 01/15/2022   Lab Results  Component Value Date   HDL 29 (L) 01/15/2022   Lab Results  Component Value Date   LDLCALC 133 (H) 01/15/2022   Lab Results  Component Value Date   TRIG 166 (H) 01/15/2022   Lab Results  Component Value Date   CHOLHDL 6.6 (H) 01/15/2022   Lab Results  Component Value Date   HGBA1C 5.3 01/15/2022      Assessment & Plan:   Problem List Items Addressed This Visit       Cardiovascular and Mediastinum   Essential hypertension - Primary    Patient BP  Vitals:   08/05/22 1550  BP: 128/82    in the office 08/05/22  Advised pt to follow a low sodium and heart healthy diet. Continue  amlodipine-benazepril daily         Respiratory   OSA (obstructive sleep apnea)    He does not use CPAP machine. He is trying to lose weight.        Other   Class 2 severe obesity due to excess calories with  serious comorbidity in adult [facility])    Body mass index is 39.68 kg/m. Advised patient to lose weight. He want to try losing weight by lifestyle intervention without any medication Advised patient to avoid trans fat, fatty and fried food. Follow a regular physical activity schedule. Went over the risk of chronic diseases with increased weight.            No orders of the defined types were placed in this encounter.    Follow-up: Return in about 3 months (around 11/04/2022).    Theresia Lo, NP

## 2022-08-05 NOTE — Assessment & Plan Note (Addendum)
Body mass index is 39.68 kg/m. Advised patient to lose weight. He want to try losing weight by lifestyle intervention without any medication Advised patient to avoid trans fat, fatty and fried food. Follow a regular physical activity schedule. Went over the risk of chronic diseases with increased weight.

## 2022-08-05 NOTE — Assessment & Plan Note (Signed)
Patient BP  Vitals:   08/05/22 1550  BP: 128/82    in the office 08/05/22  Advised pt to follow a low sodium and heart healthy diet. Continue amlodipine-benazepril daily

## 2022-08-05 NOTE — Assessment & Plan Note (Addendum)
He does not use CPAP machine. He is trying to lose weight.

## 2022-08-06 ENCOUNTER — Encounter: Payer: Self-pay | Admitting: Nurse Practitioner

## 2023-06-01 ENCOUNTER — Other Ambulatory Visit: Payer: Self-pay | Admitting: Nurse Practitioner

## 2023-06-01 DIAGNOSIS — I10 Essential (primary) hypertension: Secondary | ICD-10-CM
